# Patient Record
Sex: Female | Born: 2000 | Race: White | Hispanic: No | Marital: Single | Smoking: Never smoker
Health system: Southern US, Community
[De-identification: ages and names within clinical notes are randomized; demographics above are authoritative.]

## PROBLEM LIST (undated history)

## (undated) ENCOUNTER — Inpatient Hospital Stay: Payer: Self-pay

## (undated) ENCOUNTER — Inpatient Hospital Stay: Admission: RE | Payer: MEDICAID | Source: Home / Self Care

## (undated) DIAGNOSIS — N200 Calculus of kidney: Secondary | ICD-10-CM

## (undated) DIAGNOSIS — T4145XA Adverse effect of unspecified anesthetic, initial encounter: Secondary | ICD-10-CM

## (undated) DIAGNOSIS — R51 Headache: Secondary | ICD-10-CM

## (undated) DIAGNOSIS — R519 Headache, unspecified: Secondary | ICD-10-CM

## (undated) DIAGNOSIS — T8859XA Other complications of anesthesia, initial encounter: Secondary | ICD-10-CM

## (undated) DIAGNOSIS — R112 Nausea with vomiting, unspecified: Secondary | ICD-10-CM

## (undated) DIAGNOSIS — F419 Anxiety disorder, unspecified: Secondary | ICD-10-CM

## (undated) DIAGNOSIS — J45909 Unspecified asthma, uncomplicated: Secondary | ICD-10-CM

## (undated) DIAGNOSIS — Z8489 Family history of other specified conditions: Secondary | ICD-10-CM

## (undated) DIAGNOSIS — Z9889 Other specified postprocedural states: Secondary | ICD-10-CM

## (undated) HISTORY — PX: TONSILLECTOMY: SUR1361

## (undated) HISTORY — PX: CYSTOSCOPY: SUR368

---
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
## 2019-09-20 ENCOUNTER — Encounter: Payer: Self-pay | Admitting: Emergency Medicine

## 2019-09-20 ENCOUNTER — Emergency Department: Payer: Medicaid Other

## 2019-09-20 ENCOUNTER — Other Ambulatory Visit: Payer: Self-pay

## 2019-09-20 DIAGNOSIS — Z91040 Latex allergy status: Secondary | ICD-10-CM | POA: Diagnosis not present

## 2019-09-20 DIAGNOSIS — Z79899 Other long term (current) drug therapy: Secondary | ICD-10-CM | POA: Diagnosis not present

## 2019-09-20 DIAGNOSIS — R1031 Right lower quadrant pain: Secondary | ICD-10-CM | POA: Diagnosis present

## 2019-09-20 DIAGNOSIS — J45909 Unspecified asthma, uncomplicated: Secondary | ICD-10-CM | POA: Insufficient documentation

## 2019-09-20 DIAGNOSIS — R1033 Periumbilical pain: Secondary | ICD-10-CM | POA: Diagnosis not present

## 2019-09-20 LAB — URINALYSIS, COMPLETE (UACMP) WITH MICROSCOPIC
Bacteria, UA: NONE SEEN
Bilirubin Urine: NEGATIVE
Glucose, UA: NEGATIVE mg/dL
Hgb urine dipstick: NEGATIVE
Ketones, ur: NEGATIVE mg/dL
Nitrite: NEGATIVE
Protein, ur: NEGATIVE mg/dL
Specific Gravity, Urine: 1.014 (ref 1.005–1.030)
pH: 6 (ref 5.0–8.0)

## 2019-09-20 LAB — COMPREHENSIVE METABOLIC PANEL
ALT: 13 U/L (ref 0–44)
AST: 21 U/L (ref 15–41)
Albumin: 4.4 g/dL (ref 3.5–5.0)
Alkaline Phosphatase: 61 U/L (ref 38–126)
Anion gap: 10 (ref 5–15)
BUN: 8 mg/dL (ref 6–20)
CO2: 23 mmol/L (ref 22–32)
Calcium: 9.2 mg/dL (ref 8.9–10.3)
Chloride: 105 mmol/L (ref 98–111)
Creatinine, Ser: 0.67 mg/dL (ref 0.44–1.00)
GFR calc Af Amer: >60 mL/min (ref >60)
GFR calc non Af Amer: >60 mL/min (ref >60)
Glucose, Bld: 118 mg/dL — ABNORMAL HIGH (ref 70–99)
Potassium: 3.5 mmol/L (ref 3.5–5.1)
Sodium: 138 mmol/L (ref 135–145)
Total Bilirubin: 0.6 mg/dL (ref 0.3–1.2)
Total Protein: 7.6 g/dL (ref 6.5–8.1)

## 2019-09-20 LAB — CBC
HCT: 39.4 % (ref 36.0–46.0)
Hemoglobin: 13.6 g/dL (ref 12.0–15.0)
MCH: 30.9 pg (ref 26.0–34.0)
MCHC: 34.5 g/dL (ref 30.0–36.0)
MCV: 89.5 fL (ref 80.0–100.0)
Platelets: 251 10*3/uL (ref 150–400)
RBC: 4.4 MIL/uL (ref 3.87–5.11)
RDW: 12.2 % (ref 11.5–15.5)
WBC: 8.1 10*3/uL (ref 4.0–10.5)
nRBC: 0 % (ref 0.0–0.2)

## 2019-09-20 LAB — POCT PREGNANCY, URINE: Preg Test, Ur: NEGATIVE

## 2019-09-20 LAB — LIPASE, BLOOD: Lipase: 34 U/L (ref 11–51)

## 2019-09-20 MED ORDER — IOHEXOL 300 MG/ML  SOLN
100.0000 mL | Freq: Once | INTRAMUSCULAR | Status: AC | PRN
  Administered 2019-09-20: 100 mL via INTRAVENOUS

## 2019-09-20 NOTE — ED Triage Notes (Signed)
Pt arrives ambulatory to triage with LRQ abdominal pain x2 days. Pt reports intermittent pain and nausea with this. Pt also reports that pressure to the area increases pain. Pt is in NAD at this time.

## 2019-09-21 ENCOUNTER — Emergency Department
Admission: EM | Admit: 2019-09-21 | Discharge: 2019-09-21 | Disposition: A | Discharge status: Home / Self Care | Payer: Medicaid Other | Attending: Emergency Medicine | Admitting: Emergency Medicine

## 2019-09-21 DIAGNOSIS — R1084 Generalized abdominal pain: Secondary | ICD-10-CM

## 2019-09-21 MED ORDER — ONDANSETRON 4 MG PO TBDP: 4.0000 mg | ORAL_TABLET | Freq: Three times a day (TID) | ORAL | 0 refills | Status: DC | PRN

## 2019-09-21 MED ORDER — OMEPRAZOLE 20 MG PO CPDR: 20.0000 mg | DELAYED_RELEASE_CAPSULE | Freq: Every day | ORAL | 0 refills | Status: DC

## 2019-09-21 MED ORDER — DICYCLOMINE HCL 20 MG PO TABS: 20.0000 mg | ORAL_TABLET | Freq: Three times a day (TID) | ORAL | 0 refills | Status: DC

## 2019-09-21 MED ORDER — ALUM & MAG HYDROXIDE-SIMETH 200-200-20 MG/5ML PO SUSP
30.0000 mL | Freq: Once | ORAL | Status: AC
  Administered 2019-09-21: 30 mL via ORAL
  Filled 2019-09-21: qty 30

## 2019-09-21 MED ORDER — DICYCLOMINE HCL 10 MG PO CAPS
20.0000 mg | ORAL_CAPSULE | Freq: Once | ORAL | Status: AC
  Administered 2019-09-21: 20 mg via ORAL
  Filled 2019-09-21: qty 2

## 2019-09-21 MED ORDER — ONDANSETRON HCL 4 MG/2ML IJ SOLN
4.0000 mg | Freq: Once | INTRAMUSCULAR | Status: AC
  Administered 2019-09-21: 4 mg via INTRAVENOUS
  Filled 2019-09-21: qty 2

## 2019-09-21 NOTE — ED Provider Notes (Signed)
[facility] Emergency Department Provider Note  ____________________________________________   First MD Initiated Contact with Patient 09/21/19 [phone]     (approximate)  I have reviewed the triage vital signs and the nursing notes.   HISTORY  Chief Complaint Abdominal Pain    HPI Tiffany Bush is a 19 y.o. female  Here with abd pain. Pt reports that for the past week, she's had intermittent aching, cramping, periumbilical and RLQ pain. It has been transient but persistently worsening. It seems to worsen with standing and moving, but is also occasionally present at rest. She has also had some epigastric pressure like feeling w/ it with nausea. She notes the pain mildly worsens with eating, stress. No alleviating factors. No change in bowels. No vaginal bleeding or discharge. No dyspareunia. No other complaints.        Past Medical History:  Diagnosis Date  . Anxiety    no meds   . Asthma    well controlled-exercise induced  . Complication of anesthesia   . Family history of adverse reaction to anesthesia    mom-n/v  . Headache    more frequent during the summer with her allergies  . Kidney stones   . PONV (postoperative nausea and vomiting)    nausea    There are no problems to display for this patient.   Past Surgical History:  Procedure Laterality Date  . CYSTOSCOPY    . LAPAROSCOPY N/A 04/14/2018   Procedure: LAPAROSCOPY DIAGNOSTIC;  Surgeon: Feliberto Gottron Ihor Austin, MD;  Location: [facility] ORS;  Service: Gynecology;  Laterality: N/A;  . LAPAROSCOPY N/A 04/14/2018   Procedure: LAPAROSCOPY OPERATIVE;  Surgeon: Feliberto Gottron Ihor Austin, MD;  Location: [facility] ORS;  Service: Gynecology;  Laterality: N/A;  . TONSILLECTOMY     age 4    Prior to Admission medications   Medication Sig Start Date End Date Taking? Authorizing Provider  albuterol (PROVENTIL HFA;VENTOLIN HFA) 108 (90 Base) MCG/ACT inhaler Inhale 2 puffs into the lungs every 6 (six) hours as  needed for wheezing or shortness of breath (PER MOM, PT CURRENTLY OUT OF INHALER AND WILL NEED TO GET ANOTHER RX).  10/07/16   [provider]  cetirizine (ZYRTEC) 10 MG tablet Take 10 mg by mouth at bedtime.     [provider]  dicyclomine (BENTYL) 20 MG tablet Take 1 tablet (20 mg total) by mouth 4 (four) times daily -  before meals and at bedtime for 5 days. 09/21/19 09/26/19  Shaune Pollack, MD  ketorolac (TORADOL) 10 MG tablet Take 1 tablet (10 mg total) by mouth every 8 (eight) hours as needed. Patient not taking: Reported on 01/16/2017 10/29/15   Darci Current, MD  loperamide (IMODIUM A-D) 2 MG tablet Take 1 tablet (2 mg total) by mouth 4 (four) times daily as needed for diarrhea or loose stools. Patient not taking: Reported on 04/08/2018 01/16/17   Rockne Menghini, MD  omeprazole (PRILOSEC) 20 MG capsule Take 1 capsule (20 mg total) by mouth daily for 10 days. 09/21/19 10/01/19  Shaune Pollack, MD  ondansetron (ZOFRAN ODT) 4 MG disintegrating tablet Take 1 tablet (4 mg total) by mouth every 8 (eight) hours as needed for nausea or vomiting. 09/21/19   Shaune Pollack, MD  phenazopyridine (PYRIDIUM) 100 MG tablet Take 1 tablet (100 mg total) by mouth 3 (three) times daily as needed for pain. Patient not taking: Reported on 04/08/2018 08/26/16   Menshew, Charlesetta Ivory, PA-C  triamcinolone ointment (KENALOG) 0.1 % Apply 1  application topically 2 (two) times daily as needed (for eczema).  10/07/16   [provider]  valACYclovir (VALTREX) 500 MG tablet Take 500 mg by mouth daily as needed (for cold sores).  01/08/17   [provider]    Allergies Latex  No family history on file.  Social History Social History   Tobacco Use  . Smoking status: Never Smoker  . Smokeless tobacco: Never Used  Substance Use Topics  . Alcohol use: No  . Drug use: Never    Review of Systems  Review of Systems  Constitutional: Positive for fatigue. Negative for chills and  fever.  HENT: Negative for sore throat.   Respiratory: Negative for shortness of breath.   Cardiovascular: Negative for chest pain.  Gastrointestinal: Positive for nausea and vomiting. Negative for abdominal pain.  Genitourinary: Negative for flank pain.  Musculoskeletal: Negative for neck pain.  Skin: Negative for rash and wound.  Allergic/Immunologic: Negative for immunocompromised state.  Neurological: Negative for weakness and numbness.  Hematological: Does not bruise/bleed easily.     ____________________________________________  PHYSICAL EXAM:      VITAL SIGNS: ED Triage Vitals  Enc Vitals Group     BP 09/20/19 2144 (!) 125/54     Pulse Rate 09/20/19 2144 67     Resp 09/20/19 2144 17     Temp 09/20/19 2144 98.9 F (37.2 C)     Temp Source 09/20/19 2144 Oral     SpO2 09/20/19 2144 100 %     Weight 09/20/19 2138 160 lb (72.6 kg)     Height 09/20/19 2138 5\' 3"  (1.6 m)     Head Circumference --      Peak Flow --      Pain Score 09/20/19 2138 10     Pain Loc --      Pain Edu? --      Excl. in GC? --      Physical Exam Vitals and nursing note reviewed.  Constitutional:      General: She is not in acute distress.    Appearance: She is well-developed.  HENT:     Head: Normocephalic and atraumatic.  Eyes:     Conjunctiva/sclera: Conjunctivae normal.  Cardiovascular:     Rate and Rhythm: Normal rate and regular rhythm.     Heart sounds: Normal heart sounds. No murmur. No friction rub.  Pulmonary:     Effort: Pulmonary effort is normal. No respiratory distress.     Breath sounds: Normal breath sounds. No wheezing or rales.  Abdominal:     General: There is no distension.     Palpations: Abdomen is soft.     Tenderness: There is abdominal tenderness in the right lower quadrant, periumbilical area and suprapubic area. There is no guarding or rebound.  Musculoskeletal:     Cervical back: Neck supple.  Skin:    General: Skin is warm.     Capillary Refill: Capillary  refill takes less than 2 seconds.  Neurological:     Mental Status: She is alert and oriented to person, place, and time.     Motor: No abnormal muscle tone.       ____________________________________________   LABS (all labs ordered are listed, but only abnormal results are displayed)  Labs Reviewed  COMPREHENSIVE METABOLIC PANEL - Abnormal; Notable for the following components:      Result Value   Glucose, Bld 118 (*)    All other components within normal limits  URINALYSIS, COMPLETE (UACMP) WITH MICROSCOPIC -  Abnormal; Notable for the following components:   Color, Urine YELLOW (*)    APPearance HAZY (*)    Leukocytes,Ua SMALL (*)    All other components within normal limits  LIPASE, BLOOD  CBC  POC URINE PREG, ED  POCT PREGNANCY, URINE    ____________________________________________  EKG: None ________________________________________  RADIOLOGY All imaging, including plain films, CT scans, and ultrasounds, independently reviewed by me, and interpretations confirmed via formal radiology reads.  ED MD interpretation:   CT A/P: Normal, normal appendix, no acute abnormality  Official radiology report(s): CT ABDOMEN PELVIS W CONTRAST  Result Date: 09/20/2019 CLINICAL DATA:  Right lower quadrant pain. Nausea. EXAM: CT ABDOMEN AND PELVIS WITH CONTRAST TECHNIQUE: Multidetector CT imaging of the abdomen and pelvis was performed using the standard protocol following bolus administration of intravenous contrast. CONTRAST:  OMNIPAQUE IOHEXOL 300 MG/ML  SOLN COMPARISON:  Noncontrast CT 10/21/2016. Pelvic ultrasound 03/29/2018 FINDINGS: Lower chest: Lung bases are clear. Hepatobiliary: No focal liver abnormality is seen. No gallstones, gallbladder wall thickening, or biliary dilatation. Pancreas: No ductal dilatation or inflammation. Spleen: Normal in size without focal abnormality. Splenule is anteriorly. Adrenals/Urinary Tract: Normal adrenal glands. No hydronephrosis or  perinephric edema. Homogeneous renal enhancement. Urinary bladder is nondistended, otherwise unremarkable. Stomach/Bowel: Stomach is within normal limits. Appendix is normal, series 2, image 55. No evidence of bowel wall thickening, distention, or inflammatory changes. Terminal ileum is normal. Vascular/Lymphatic: Abdominal aorta and IVC are unremarkable. The portal vein is patent. Mesenteric vessels are patent. No adenopathy. Reproductive: Symmetric prominently sized ovaries with multiple follicles, similar to prior pelvic ultrasound. No ovarian cyst or adnexal mass. Uterus is unremarkable. Other: No abdominal wall hernia or abnormality. No abdominopelvic ascites. Musculoskeletal: There are no acute or suspicious osseous abnormalities. IMPRESSION: No acute abnormality or explanation for abdominal pain. Normal appendix. Electronically Signed   By: Narda Rutherford M.D.   On: 09/20/2019 22:56    ____________________________________________  PROCEDURES   Procedure(s) performed (including Critical Care):  Procedures  ____________________________________________  INITIAL IMPRESSION / MDM / ASSESSMENT AND PLAN / ED COURSE  As part of my medical decision making, I reviewed the following data within the electronic MEDICAL RECORD NUMBER Nursing notes reviewed and incorporated, Old chart reviewed, Notes from prior ED visits, and [place] Controlled Substance Database       *Tiffany Bush was evaluated in Emergency Department on 09/21/2019 for the symptoms described in the history of present illness. She was evaluated in the context of the global COVID-19 pandemic, which necessitated consideration that the patient might be at risk for infection with the SARS-CoV-2 virus that causes COVID-19. Institutional protocols and algorithms that pertain to the evaluation of patients at risk for COVID-19 are in a state of rapid change based on information released by regulatory bodies including the CDC and federal and state  organizations. These policies and algorithms were followed during the patient's care in the ED.  Some ED evaluations and interventions may be delayed as a result of limited staffing during the pandemic.*     Medical Decision Making:  19 yo F here with mild periumbilical and RLQ abd pain. Pt VSS and labs are overall very reassuring. Normal WBC, normal renal function, LFTs, and lipase. No evidence of UTI clinically or on UA. UPT negative. CT scan obtained in triage reviewed by me and is negative. Do not suspect appendicitis, cholecystitis, or other surgical abnormality. DDx includes gastritis/PUD, alcoholic gastritis, possibly IBD given relation w/ anxiety. Denies any high risk sexual activity, vaginal  bleeding/discharge or sx of PID or GU etiology. Will treat symptomatically, d/c with outpt follow-up.  ____________________________________________  FINAL CLINICAL IMPRESSION(S) / ED DIAGNOSES  Final diagnoses:  Generalized abdominal pain     MEDICATIONS GIVEN DURING THIS VISIT:  Medications  iohexol (OMNIPAQUE) 300 MG/ML solution 100 mL (100 mLs Intravenous Contrast Given 09/20/19 2243)  dicyclomine (BENTYL) capsule 20 mg (20 mg Oral Given 09/21/19 0624)  ondansetron (ZOFRAN) injection 4 mg (4 mg Intravenous Given 09/21/19 0624)  alum & mag hydroxide-simeth (MAALOX/MYLANTA) 200-200-20 MG/5ML suspension 30 mL (30 mLs Oral Given 09/21/19 0634)     ED Discharge Orders         Ordered    omeprazole (PRILOSEC) 20 MG capsule  Daily     09/21/19 0702    ondansetron (ZOFRAN ODT) 4 MG disintegrating tablet  Every 8 hours PRN     09/21/19 0702    dicyclomine (BENTYL) 20 MG tablet  3 times daily before meals & bedtime     09/21/19 4259           Note:  This document was prepared using Dragon voice recognition software and may include unintentional dictation errors.   Duffy Bruce, MD 09/21/19 [phone]

## 2019-09-21 NOTE — ED Notes (Signed)
NAD noted at time of D/C. Pt denies questions or concerns. Pt ambulatory to the lobby at this time.  

## 2019-12-21 ENCOUNTER — Emergency Department
Admission: EM | Admit: 2019-12-21 | Discharge: 2019-12-21 | Disposition: A | Discharge status: Home / Self Care | Payer: Medicaid Other | Attending: Emergency Medicine | Admitting: Emergency Medicine

## 2019-12-21 ENCOUNTER — Other Ambulatory Visit: Payer: Self-pay

## 2019-12-21 DIAGNOSIS — J45909 Unspecified asthma, uncomplicated: Secondary | ICD-10-CM | POA: Insufficient documentation

## 2019-12-21 DIAGNOSIS — Z79899 Other long term (current) drug therapy: Secondary | ICD-10-CM | POA: Diagnosis not present

## 2019-12-21 DIAGNOSIS — Z91040 Latex allergy status: Secondary | ICD-10-CM | POA: Insufficient documentation

## 2019-12-21 DIAGNOSIS — N898 Other specified noninflammatory disorders of vagina: Secondary | ICD-10-CM | POA: Diagnosis present

## 2019-12-21 DIAGNOSIS — A5901 Trichomonal vulvovaginitis: Secondary | ICD-10-CM | POA: Insufficient documentation

## 2019-12-21 LAB — COMPREHENSIVE METABOLIC PANEL
ALT: 13 U/L (ref 0–44)
AST: 20 U/L (ref 15–41)
Albumin: 4.3 g/dL (ref 3.5–5.0)
Alkaline Phosphatase: 65 U/L (ref 38–126)
Anion gap: 7 (ref 5–15)
BUN: 8 mg/dL (ref 6–20)
CO2: 25 mmol/L (ref 22–32)
Calcium: 9.2 mg/dL (ref 8.9–10.3)
Chloride: 108 mmol/L (ref 98–111)
Creatinine, Ser: 0.83 mg/dL (ref 0.44–1.00)
GFR calc Af Amer: >60 mL/min (ref >60)
GFR calc non Af Amer: >60 mL/min (ref >60)
Glucose, Bld: 96 mg/dL (ref 70–99)
Potassium: 3.6 mmol/L (ref 3.5–5.1)
Sodium: 140 mmol/L (ref 135–145)
Total Bilirubin: 0.9 mg/dL (ref 0.3–1.2)
Total Protein: 7.4 g/dL (ref 6.5–8.1)

## 2019-12-21 LAB — URINALYSIS, COMPLETE (UACMP) WITH MICROSCOPIC
Bacteria, UA: NONE SEEN
Bilirubin Urine: NEGATIVE
Glucose, UA: NEGATIVE mg/dL
Hgb urine dipstick: NEGATIVE
Ketones, ur: NEGATIVE mg/dL
Nitrite: NEGATIVE
Protein, ur: NEGATIVE mg/dL
Specific Gravity, Urine: 1.006 (ref 1.005–1.030)
pH: 7 (ref 5.0–8.0)

## 2019-12-21 LAB — CBC
HCT: 39.6 % (ref 36.0–46.0)
Hemoglobin: 14.3 g/dL (ref 12.0–15.0)
MCH: 32 pg (ref 26.0–34.0)
MCHC: 36.1 g/dL — ABNORMAL HIGH (ref 30.0–36.0)
MCV: 88.6 fL (ref 80.0–100.0)
Platelets: 242 10*3/uL (ref 150–400)
RBC: 4.47 MIL/uL (ref 3.87–5.11)
RDW: 12 % (ref 11.5–15.5)
WBC: 10 10*3/uL (ref 4.0–10.5)
nRBC: 0 % (ref 0.0–0.2)

## 2019-12-21 LAB — POCT PREGNANCY, URINE: Preg Test, Ur: NEGATIVE

## 2019-12-21 LAB — WET PREP, GENITAL
Clue Cells Wet Prep HPF POC: NONE SEEN
Sperm: NONE SEEN
Yeast Wet Prep HPF POC: NONE SEEN

## 2019-12-21 LAB — LIPASE, BLOOD: Lipase: 29 U/L (ref 11–51)

## 2019-12-21 LAB — CHLAMYDIA/NGC RT PCR (ARMC ONLY)
Chlamydia Tr: DETECTED — AB
N gonorrhoeae: NOT DETECTED

## 2019-12-21 MED ORDER — METRONIDAZOLE 500 MG PO TABS: 500.0000 mg | ORAL_TABLET | Freq: Two times a day (BID) | ORAL | 0 refills | Status: DC

## 2019-12-21 MED ORDER — SODIUM CHLORIDE 0.9% FLUSH: 3.0000 mL | Freq: Once | INTRAVENOUS | Status: DC

## 2019-12-21 NOTE — ED Notes (Signed)
Pelvic exam cart place in room. UA sent to lab.

## 2019-12-21 NOTE — ED Triage Notes (Signed)
Pt c/o watery discharge for the past couple of day with lower abd pain.

## 2019-12-21 NOTE — ED Notes (Signed)
See triage note  Presents with some vaginal discharge  States she noticed "clear,watery "discharge yesterday  Also thinks she may have yeast infection so she used Mono stat

## 2019-12-21 NOTE — ED Provider Notes (Signed)
[facility] Emergency Department Provider Note   ____________________________________________   First MD Initiated Contact with Patient 12/21/19 1412     (approximate)  I have reviewed the triage vital signs and the nursing notes.   HISTORY  Chief Complaint Vaginal Discharge   HPI Tiffany Bush is a 19 y.o. female presents to the ED with complaint of vaginal discharge that began yesterday.  Patient states that she began having itching and felt "raw".  Patient thought she had a yeast infection and began using Monistat this morning.  She states that initially the vaginal discharge was a yellow watery appearance.  Rates her discomfort as a 10/10.     Past Medical History:  Diagnosis Date  . Anxiety    no meds   . Asthma    well controlled-exercise induced  . Complication of anesthesia   . Family history of adverse reaction to anesthesia    mom-n/v  . Headache    more frequent during the summer with her allergies  . Kidney stones   . PONV (postoperative nausea and vomiting)    nausea    There are no problems to display for this patient.   Past Surgical History:  Procedure Laterality Date  . CYSTOSCOPY    . LAPAROSCOPY N/A 04/14/2018   Procedure: LAPAROSCOPY DIAGNOSTIC;  Surgeon: Feliberto Gottron Ihor Austin, MD;  Location: [facility] ORS;  Service: Gynecology;  Laterality: N/A;  . LAPAROSCOPY N/A 04/14/2018   Procedure: LAPAROSCOPY OPERATIVE;  Surgeon: Feliberto Gottron Ihor Austin, MD;  Location: [facility] ORS;  Service: Gynecology;  Laterality: N/A;  . TONSILLECTOMY     age 50    Prior to Admission medications   Medication Sig Start Date End Date Taking? Authorizing Provider  albuterol (PROVENTIL HFA;VENTOLIN HFA) 108 (90 Base) MCG/ACT inhaler Inhale 2 puffs into the lungs every 6 (six) hours as needed for wheezing or shortness of breath (PER MOM, PT CURRENTLY OUT OF INHALER AND WILL NEED TO GET ANOTHER RX).  10/07/16   [provider]  cetirizine (ZYRTEC)  10 MG tablet Take 10 mg by mouth at bedtime.     [provider]  dicyclomine (BENTYL) 20 MG tablet Take 1 tablet (20 mg total) by mouth 4 (four) times daily -  before meals and at bedtime for 5 days. 09/21/19 09/26/19  Shaune Pollack, MD  metroNIDAZOLE (FLAGYL) 500 MG tablet Take 1 tablet (500 mg total) by mouth 2 (two) times daily. 12/21/19   Tommi Rumps, PA-C  omeprazole (PRILOSEC) 20 MG capsule Take 1 capsule (20 mg total) by mouth daily for 10 days. 09/21/19 10/01/19  Shaune Pollack, MD  triamcinolone ointment (KENALOG) 0.1 % Apply 1 application topically 2 (two) times daily as needed (for eczema).  10/07/16   [provider]  valACYclovir (VALTREX) 500 MG tablet Take 500 mg by mouth daily as needed (for cold sores).  01/08/17   [provider]    Allergies Latex  No family history on file.  Social History Social History   Tobacco Use  . Smoking status: Never Smoker  . Smokeless tobacco: Never Used  Substance Use Topics  . Alcohol use: No  . Drug use: Never    Review of Systems Constitutional: No fever/chills Cardiovascular: Denies chest pain. Respiratory: Denies shortness of breath. Gastrointestinal: No abdominal pain.  No nausea, no vomiting.   Genitourinary: Negative for dysuria.  Positive vaginal discharge. Musculoskeletal: Negative for back pain. Skin: Negative for rash. Neurological: Negative for headaches, focal weakness or numbness. ____________________________________________  PHYSICAL EXAM:  VITAL SIGNS: ED Triage Vitals  Enc Vitals Group     BP 12/21/19 1316 135/70     Pulse Rate 12/21/19 1316 83     Resp 12/21/19 1316 16     Temp 12/21/19 1316 98 F (36.7 C)     Temp Source 12/21/19 1316 Oral     SpO2 12/21/19 1316 99 %     Weight 12/21/19 1414 160 lb 15 oz (73 kg)     Height 12/21/19 1414 5\' 3"  (1.6 m)     Head Circumference --      Peak Flow --      Pain Score 12/21/19 1310 10     Pain Loc --      Pain Edu? --       Excl. in GC? --     Constitutional: Alert and oriented. Well appearing and in no acute distress. Eyes: Conjunctivae are normal. PERRL. EOMI. Head: Atraumatic. Neck: No stridor.   Cardiovascular: Normal rate, regular rhythm. Grossly normal heart sounds.  Good peripheral circulation. Respiratory: Normal respiratory effort.  No retractions. Lungs CTAB. Gastrointestinal: Soft and nontender. No distention. Genitourinary: External genitalia was unremarkable.  There is moderate amount of yellow secretions noted especially in the vaginal cuff.  Wet prep and cultures were obtained.  Patient had no adnexal masses or tenderness.  No cervical motion tenderness. Musculoskeletal: Moves upper and lower extremities that any difficulty normal gait was noted. Neurologic:  Normal speech and language. No gross focal neurologic deficits are appreciated. No gait instability. Skin:  Skin is warm, dry and intact. No rash noted. Psychiatric: Mood and affect are normal. Speech and behavior are normal.  ____________________________________________   LABS (all labs ordered are listed, but only abnormal results are displayed)  Labs Reviewed  WET PREP, GENITAL - Abnormal; Notable for the following components:      Result Value   Trich, Wet Prep PRESENT (*)    WBC, Wet Prep HPF POC MANY (*)    All other components within normal limits  CBC - Abnormal; Notable for the following components:   MCHC 36.1 (*)    All other components within normal limits  URINALYSIS, COMPLETE (UACMP) WITH MICROSCOPIC - Abnormal; Notable for the following components:   Color, Urine YELLOW (*)    APPearance HAZY (*)    Leukocytes,Ua MODERATE (*)    All other components within normal limits  CHLAMYDIA/NGC RT PCR ([facility] ONLY)  LIPASE, BLOOD  COMPREHENSIVE METABOLIC PANEL  POC URINE PREG, ED  POCT PREGNANCY, URINE     PROCEDURES  Procedure(s) performed (including Critical  Care):  Procedures   ____________________________________________   INITIAL IMPRESSION / ASSESSMENT AND PLAN / ED COURSE  As part of my medical decision making, I reviewed the following data within the electronic MEDICAL RECORD NUMBER Notes from prior ED visits and [place] Controlled Substance Database  19 year old female presents to the ED with complaint of vaginal itching and feeling well since yesterday.  Patient states that she used Monistat this morning without any relief.  Patient states that the itching has gotten worse.  She states that prior to using the Monistat she saw a clear watery discharge.  Wet prep shows trichomonas and patient was made aware that this is a sexually transmitted disease.  A prescription for Flagyl was sent to her pharmacy.  She is also made aware that the chlamydia and gonorrhea test has not resulted and if this is positive she will get a phone call letting her know  that.  She may follow-up with the [facility] should she continue to have any continued problems.  ____________________________________________   FINAL CLINICAL IMPRESSION(S) / ED DIAGNOSES  Final diagnoses:  Trichomonas vaginitis     ED Discharge Orders         Ordered    metroNIDAZOLE (FLAGYL) 500 MG tablet  2 times daily     12/21/19 1549           Note:  This document was prepared using Dragon voice recognition software and may include unintentional dictation errors.    Johnn Hai, PA-C 12/21/19 1554    Blake Divine, MD 12/22/19 [phone]

## 2019-12-21 NOTE — Discharge Instructions (Signed)
Follow-up with your primary care provider or [facility] department if any continued problems.  Also your sexual partner will need to be treated as well.  This is a sexually transmitted disease.  Begin taking the Flagyl twice a day for the next 7 days.  You may discontinue using the Monistat as it will not help with this and there was no yeast seen on your wet prep.  A gonorrhea and Chlamydia test was sent to the lab.  This information will result most likely later this evening.  If this is positive you will get a phone call and you may be treated for free at the health department.

## 2019-12-23 ENCOUNTER — Telehealth: Payer: Self-pay | Admitting: Emergency Medicine

## 2019-12-29 ENCOUNTER — Encounter: Payer: Self-pay | Admitting: Emergency Medicine

## 2019-12-29 ENCOUNTER — Other Ambulatory Visit: Payer: Self-pay

## 2019-12-29 ENCOUNTER — Emergency Department
Admission: EM | Admit: 2019-12-29 | Discharge: 2019-12-29 | Disposition: A | Discharge status: Home / Self Care | Payer: Medicaid Other | Attending: Emergency Medicine | Admitting: Emergency Medicine

## 2019-12-29 ENCOUNTER — Emergency Department: Payer: Medicaid Other

## 2019-12-29 DIAGNOSIS — J069 Acute upper respiratory infection, unspecified: Secondary | ICD-10-CM | POA: Insufficient documentation

## 2019-12-29 DIAGNOSIS — J45909 Unspecified asthma, uncomplicated: Secondary | ICD-10-CM | POA: Diagnosis not present

## 2019-12-29 DIAGNOSIS — Z91040 Latex allergy status: Secondary | ICD-10-CM | POA: Insufficient documentation

## 2019-12-29 DIAGNOSIS — R05 Cough: Secondary | ICD-10-CM | POA: Diagnosis present

## 2019-12-29 LAB — POCT PREGNANCY, URINE: Preg Test, Ur: NEGATIVE

## 2019-12-29 MED ORDER — METHYLPREDNISOLONE 4 MG PO TBPK: ORAL_TABLET | ORAL | 0 refills | Status: DC

## 2019-12-29 MED ORDER — PSEUDOEPH-BROMPHEN-DM 30-2-10 MG/5ML PO SYRP: 5.0000 mL | ORAL_SOLUTION | Freq: Four times a day (QID) | ORAL | 0 refills | Status: DC | PRN

## 2019-12-29 NOTE — ED Triage Notes (Addendum)
Pt presents to ED with non-productive cough and nasal congestion for the past 3 days. Pt states her chest is burning and she feels like she needs a breathing treatment. Has inhaler at home but pt states it doesn't work for her

## 2019-12-29 NOTE — ED Provider Notes (Signed)
[facility] Emergency Department Provider Note   ____________________________________________   First MD Initiated Contact with Patient 12/29/19 [phone]     (approximate)  I have reviewed the triage vital signs and the nursing notes.   HISTORY  Chief Complaint Cough and Nasal Congestion    HPI Tiffany Bush is a 19 y.o. female patient complain of cough and nasal congestion for 3 days.  Patient stated feel like her chest is "burning".  Patient states she has an inhaler at home was not working.  Patient denies recent travel or known exposure to COVID-19.  Wants to go on record that she refused to take a Covid test.      Past Medical History:  Diagnosis Date  . Anxiety    no meds   . Asthma    well controlled-exercise induced  . Complication of anesthesia   . Family history of adverse reaction to anesthesia    mom-n/v  . Headache    more frequent during the summer with her allergies  . Kidney stones   . PONV (postoperative nausea and vomiting)    nausea    There are no problems to display for this patient.   Past Surgical History:  Procedure Laterality Date  . CYSTOSCOPY    . LAPAROSCOPY N/A 04/14/2018   Procedure: LAPAROSCOPY DIAGNOSTIC;  Surgeon: Ouida Sills Gwen Her, MD;  Location: [facility] ORS;  Service: Gynecology;  Laterality: N/A;  . LAPAROSCOPY N/A 04/14/2018   Procedure: LAPAROSCOPY OPERATIVE;  Surgeon: Ouida Sills Gwen Her, MD;  Location: [facility] ORS;  Service: Gynecology;  Laterality: N/A;  . TONSILLECTOMY     age 46    Prior to Admission medications   Medication Sig Start Date End Date Taking? Authorizing Provider  albuterol (PROVENTIL HFA;VENTOLIN HFA) 108 (90 Base) MCG/ACT inhaler Inhale 2 puffs into the lungs every 6 (six) hours as needed for wheezing or shortness of breath (PER MOM, PT CURRENTLY OUT OF INHALER AND WILL NEED TO GET ANOTHER RX).  10/07/16   [provider]  brompheniramine-pseudoephedrine-DM 30-2-10 MG/5ML  syrup Take 5 mLs by mouth 4 (four) times daily as needed. 12/29/19   Sable Feil, PA-C  cetirizine (ZYRTEC) 10 MG tablet Take 10 mg by mouth at bedtime.     [provider]  dicyclomine (BENTYL) 20 MG tablet Take 1 tablet (20 mg total) by mouth 4 (four) times daily -  before meals and at bedtime for 5 days. 09/21/19 09/26/19  Duffy Bruce, MD  methylPREDNISolone (MEDROL DOSEPAK) 4 MG TBPK tablet Take Tapered dose as directed 12/29/19   Sable Feil, PA-C  omeprazole (PRILOSEC) 20 MG capsule Take 1 capsule (20 mg total) by mouth daily for 10 days. 09/21/19 10/01/19  Duffy Bruce, MD  triamcinolone ointment (KENALOG) 0.1 % Apply 1 application topically 2 (two) times daily as needed (for eczema).  10/07/16   [provider]  valACYclovir (VALTREX) 500 MG tablet Take 500 mg by mouth daily as needed (for cold sores).  01/08/17   [provider]    Allergies Latex  No family history on file.  Social History Social History   Tobacco Use  . Smoking status: Never Smoker  . Smokeless tobacco: Never Used  Substance Use Topics  . Alcohol use: No  . Drug use: Never    Review of Systems Constitutional: No fever/chills Eyes: No visual changes. ENT: No sore throat.  Nasal congestion. Cardiovascular: Denies chest pain. Respiratory: Denies shortness of breath.  Nonproductive cough. Gastrointestinal: No abdominal  pain.  No nausea, no vomiting.  No diarrhea.  No constipation. Genitourinary: Negative for dysuria. Musculoskeletal: Negative for back pain. Skin: Negative for rash. Neurological: Negative for headaches, focal weakness or numbness. Psychiatric:  Anxiety Allergic/Immunilogical: Latex ____________________________________________   PHYSICAL EXAM:  VITAL SIGNS: ED Triage Vitals  Enc Vitals Group     BP 12/29/19 0310 131/68     Pulse Rate 12/29/19 0310 60     Resp 12/29/19 0310 16     Temp 12/29/19 0310 98.9 F (37.2 C)     Temp Source 12/29/19 0310  Oral     SpO2 12/29/19 0310 98 %     Weight 12/29/19 0304 160 lb (72.6 kg)     Height 12/29/19 0304 5\' 3"  (1.6 m)     Head Circumference --      Peak Flow --      Pain Score 12/29/19 0304 10     Pain Loc --      Pain Edu? --      Excl. in GC? --     Constitutional: Alert and oriented. Well appearing and in no acute distress. Nose: No congestion/rhinnorhea.  Edematous nasal turbinates clear rhinorrhea. Mouth/Throat: Mucous membranes are moist.  Oropharynx non-erythematous.  Postnasal drainage. Neck: No stridor.  Hematological/Lymphatic/Immunilogical: No cervical lymphadenopathy. Cardiovascular: Normal rate, regular rhythm. Grossly normal heart sounds.  Good peripheral circulation. Respiratory: Normal respiratory effort.  No retractions. Lungs CTAB. Skin:  Skin is warm, dry and intact. No rash noted. Psychiatric: Mood and affect are normal. Speech and behavior are normal.  ____________________________________________   LABS (all labs ordered are listed, but only abnormal results are displayed)  Labs Reviewed  POC URINE PREG, ED  POCT PREGNANCY, URINE   ____________________________________________  EKG   ____________________________________________  RADIOLOGY  ED MD interpretation:    Official radiology report(s): DG Chest 2 View  Result Date: 12/29/2019 CLINICAL DATA:  Cough EXAM: CHEST - 2 VIEW COMPARISON:  04/08/2013 FINDINGS: The heart size and mediastinal contours are within normal limits. Both lungs are clear. The visualized skeletal structures are unremarkable. IMPRESSION: No active cardiopulmonary disease. Electronically Signed   By: 04/10/2013 M.D.   On: 12/29/2019 03:37    ____________________________________________   PROCEDURES  Procedure(s) performed (including Critical Care):  Procedures   ____________________________________________   INITIAL IMPRESSION / ASSESSMENT AND PLAN / ED COURSE  As part of my medical decision making, I reviewed  the following data within the electronic MEDICAL RECORD NUMBER     Patient presents with with nasal congestion cough.  Discussed negative chest x-ray findings with patient.  Patient physical exam is consistent with viral respiratory infection with cough.  Patient refused COVID-19 test.  Patient given discharge care instruction advised take medication as directed.  Patient advised follow-up PCP.    Tiffany Bush was evaluated in Emergency Department on 12/29/2019 for the symptoms described in the history of present illness. She was evaluated in the context of the global COVID-19 pandemic, which necessitated consideration that the patient might be at risk for infection with the SARS-CoV-2 virus that causes COVID-19. Institutional protocols and algorithms that pertain to the evaluation of patients at risk for COVID-19 are in a state of rapid change based on information released by regulatory bodies including the CDC and federal and state organizations. These policies and algorithms were followed during the patient's care in the ED.       ____________________________________________   FINAL CLINICAL IMPRESSION(S) / ED DIAGNOSES  Final diagnoses:  Viral URI with  cough     ED Discharge Orders         Ordered    methylPREDNISolone (MEDROL DOSEPAK) 4 MG TBPK tablet     12/29/19 0843    brompheniramine-pseudoephedrine-DM 30-2-10 MG/5ML syrup  4 times daily PRN     12/29/19 0843           Note:  This document was prepared using Dragon voice recognition software and may include unintentional dictation errors.    Joni Reining, PA-C 12/29/19 Lisbeth Renshaw    Shaune Pollack, MD 01/04/20 [phone]

## 2019-12-29 NOTE — ED Notes (Signed)
See triage note  Presents with  Cough and some discomfort in chest with cough   Denies anyfever

## 2019-12-29 NOTE — Discharge Instructions (Addendum)
Your chest x-ray was unremarkable.  Follow discharge care instruction take medication as directed.

## 2020-01-20 ENCOUNTER — Emergency Department: Payer: Medicaid Other

## 2020-01-20 ENCOUNTER — Encounter: Payer: Self-pay | Admitting: Emergency Medicine

## 2020-01-20 ENCOUNTER — Other Ambulatory Visit: Payer: Self-pay

## 2020-01-20 ENCOUNTER — Emergency Department
Admission: EM | Admit: 2020-01-20 | Discharge: 2020-01-20 | Disposition: A | Discharge status: Home / Self Care | Payer: Medicaid Other | Attending: Emergency Medicine | Admitting: Emergency Medicine

## 2020-01-20 DIAGNOSIS — J45909 Unspecified asthma, uncomplicated: Secondary | ICD-10-CM | POA: Insufficient documentation

## 2020-01-20 DIAGNOSIS — M79671 Pain in right foot: Secondary | ICD-10-CM | POA: Insufficient documentation

## 2020-01-20 DIAGNOSIS — Z91040 Latex allergy status: Secondary | ICD-10-CM | POA: Diagnosis not present

## 2020-01-20 DIAGNOSIS — M79672 Pain in left foot: Secondary | ICD-10-CM | POA: Insufficient documentation

## 2020-01-20 DIAGNOSIS — Z79899 Other long term (current) drug therapy: Secondary | ICD-10-CM | POA: Insufficient documentation

## 2020-01-20 MED ORDER — MELOXICAM 7.5 MG PO TABS: 7.5000 mg | ORAL_TABLET | Freq: Every day | ORAL | 0 refills | Status: AC

## 2020-01-20 NOTE — ED Notes (Signed)
Pt otf for imaging 

## 2020-01-20 NOTE — ED Provider Notes (Signed)
[facility] Emergency Department Provider Note  ____________________________________________  Time seen: Approximately 10:22 AM  I have reviewed the triage vital signs and the nursing notes.   HISTORY  Chief Complaint Leg Pain    HPI Tiffany Bush is a 19 y.o. female that presents to the emergency department for evaluation of bilateral shin, ankle, foot pain for 1 to 2 years.  Patient states that pain starts when she is up on her feet all day.  Her feet hurt most of the day, but her ankle starts to hurt after she has been standing for long periods.  She has tried several different shoes.  She currently wears sketchers.  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  No specific trauma.  Patient has tried Tylenol and Motrin without relief.  Patient takes oral birth control.  Past Medical History:  Diagnosis Date  . Anxiety    no meds   . Asthma    well controlled-exercise induced  . Complication of anesthesia   . Family history of adverse reaction to anesthesia    mom-n/v  . Headache    more frequent during the summer with her allergies  . Kidney stones   . PONV (postoperative nausea and vomiting)    nausea    There are no problems to display for this patient.   Past Surgical History:  Procedure Laterality Date  . CYSTOSCOPY    . LAPAROSCOPY N/A 04/14/2018   Procedure: LAPAROSCOPY DIAGNOSTIC;  Surgeon: Ouida Sills Gwen Her, MD;  Location: [facility] ORS;  Service: Gynecology;  Laterality: N/A;  . LAPAROSCOPY N/A 04/14/2018   Procedure: LAPAROSCOPY OPERATIVE;  Surgeon: Ouida Sills Gwen Her, MD;  Location: [facility] ORS;  Service: Gynecology;  Laterality: N/A;  . TONSILLECTOMY     age 62    Prior to Admission medications   Medication Sig Start Date End Date Taking? Authorizing Provider  albuterol (PROVENTIL HFA;VENTOLIN HFA) 108 (90 Base) MCG/ACT inhaler Inhale 2 puffs into the lungs every 6 (six) hours  as needed for wheezing or shortness of breath (PER MOM, PT CURRENTLY OUT OF INHALER AND WILL NEED TO GET ANOTHER RX).  10/07/16   [provider]  brompheniramine-pseudoephedrine-DM 30-2-10 MG/5ML syrup Take 5 mLs by mouth 4 (four) times daily as needed. 12/29/19   Sable Feil, PA-C  cetirizine (ZYRTEC) 10 MG tablet Take 10 mg by mouth at bedtime.     [provider]  dicyclomine (BENTYL) 20 MG tablet Take 1 tablet (20 mg total) by mouth 4 (four) times daily -  before meals and at bedtime for 5 days. 09/21/19 09/26/19  Duffy Bruce, MD  meloxicam (MOBIC) 7.5 MG tablet Take 1 tablet (7.5 mg total) by mouth daily for 7 days. 01/20/20 01/27/20  Laban Emperor, PA-C  methylPREDNISolone (MEDROL DOSEPAK) 4 MG TBPK tablet Take Tapered dose as directed 12/29/19   Sable Feil, PA-C  omeprazole (PRILOSEC) 20 MG capsule Take 1 capsule (20 mg total) by mouth daily for 10 days. 09/21/19 10/01/19  Duffy Bruce, MD  triamcinolone ointment (KENALOG) 0.1 % Apply 1 application topically 2 (two) times daily as needed (for eczema).  10/07/16   [provider]  valACYclovir (VALTREX) 500 MG tablet Take 500 mg by mouth daily as needed (for cold sores).  01/08/17   [provider]    Allergies Latex  History reviewed. No pertinent family history.  Social History Social History   Tobacco Use  .  Smoking status: Never Smoker  . Smokeless tobacco: Never Used  Substance Use Topics  . Alcohol use: No  . Drug use: Never     Review of Systems  Respiratory: No SOB. Gastrointestinal: No nausea, no vomiting.  Musculoskeletal: Positive for bilateral foot pain. Skin: Negative for rash, abrasions, lacerations, ecchymosis. Neurological: Negative for headaches, numbness or tingling   ____________________________________________   PHYSICAL EXAM:  VITAL SIGNS: ED Triage Vitals [01/20/20 0848]  Enc Vitals Group     BP 124/68     Pulse Rate (!) 55     Resp 18     Temp 98.4 F  (36.9 C)     Temp Source Oral     SpO2 100 %     Weight 162 lb (73.5 kg)     Height 5\' 3"  (1.6 m)     Head Circumference      Peak Flow      Pain Score 10     Pain Loc      Pain Edu?      Excl. in GC?      Constitutional: Alert and oriented. Well appearing and in no acute distress. Eyes: Conjunctivae are normal. PERRL. EOMI. Head: Atraumatic. ENT:      Ears:      Nose: No congestion/rhinnorhea.      Mouth/Throat: Mucous membranes are moist.  Neck: No stridor.   Cardiovascular: Normal rate, regular rhythm.  Good peripheral circulation.  Symmetric pedal pulses bilaterally. Respiratory: Normal respiratory effort without tachypnea or retractions. Lungs CTAB. Good air entry to the bases with no decreased or absent breath sounds. Musculoskeletal: Full range of motion to all extremities. No gross deformities appreciated.  Feet are warm to touch. Neurologic:  Normal speech and language. No gross focal neurologic deficits are appreciated.  Skin:  Skin is warm, dry and intact. No rash noted. Psychiatric: Mood and affect are normal. Speech and behavior are normal. Patient exhibits appropriate insight and judgement.   ____________________________________________   LABS (all labs ordered are listed, but only abnormal results are displayed)  Labs Reviewed - No data to display ____________________________________________  EKG   ____________________________________________  RADIOLOGY , personally viewed and evaluated these images (plain radiographs) as part of my medical decision making, as well as reviewing the written report by the radiologist.  Lexine Baton Venous Img Lower Bilateral  Result Date: 01/20/2020 CLINICAL DATA:  Lower extremity pain and edema. EXAM: BILATERAL LOWER EXTREMITY VENOUS DOPPLER ULTRASOUND TECHNIQUE: Gray-scale sonography with graded compression, as well as color Doppler and duplex ultrasound were performed to evaluate the lower extremity deep venous  systems from the level of the common femoral vein and including the common femoral, femoral, profunda femoral, popliteal and calf veins including the posterior tibial, peroneal and gastrocnemius veins when visible. The superficial great saphenous vein was also interrogated. Spectral Doppler was utilized to evaluate flow at rest and with distal augmentation maneuvers in the common femoral, femoral and popliteal veins. COMPARISON:  None. FINDINGS: RIGHT LOWER EXTREMITY Common Femoral Vein: No evidence of thrombus. Normal compressibility, respiratory phasicity and response to augmentation. Saphenofemoral Junction: No evidence of thrombus. Normal compressibility and flow on color Doppler imaging. Profunda Femoral Vein: No evidence of thrombus. Normal compressibility and flow on color Doppler imaging. Femoral Vein: No evidence of thrombus. Normal compressibility, respiratory phasicity and response to augmentation. Popliteal Vein: No evidence of thrombus. Normal compressibility, respiratory phasicity and response to augmentation. Calf Veins: No evidence of thrombus. Normal compressibility and flow on color Doppler imaging.  Superficial Great Saphenous Vein: No evidence of thrombus. Normal compressibility. Venous Reflux:  None. Other Findings: No evidence of superficial thrombophlebitis or abnormal fluid collection. LEFT LOWER EXTREMITY Common Femoral Vein: No evidence of thrombus. Normal compressibility, respiratory phasicity and response to augmentation. Saphenofemoral Junction: No evidence of thrombus. Normal compressibility and flow on color Doppler imaging. Profunda Femoral Vein: No evidence of thrombus. Normal compressibility and flow on color Doppler imaging. Femoral Vein: No evidence of thrombus. Normal compressibility, respiratory phasicity and response to augmentation. Popliteal Vein: No evidence of thrombus. Normal compressibility, respiratory phasicity and response to augmentation. Calf Veins: No evidence of  thrombus. Normal compressibility and flow on color Doppler imaging. Superficial Great Saphenous Vein: No evidence of thrombus. Normal compressibility. Venous Reflux:  None. Other Findings: No evidence of superficial thrombophlebitis or abnormal fluid collection. IMPRESSION: No evidence of deep venous thrombosis in either lower extremity. Electronically Signed   By: Irish Lack M.D.   On: 01/20/2020 11:14   DG Foot 2 Views Left  Result Date: 01/20/2020 CLINICAL DATA:  Bilateral foot pain for 2 years associated with standing. EXAM: LEFT FOOT - 2 VIEW COMPARISON:  None. FINDINGS: There is no evidence of fracture or dislocation. There is no evidence of arthropathy or other focal bone abnormality. Soft tissues are unremarkable. IMPRESSION: Negative. Electronically Signed   By: Marnee Spring M.D.   On: 01/20/2020 10:43   DG Foot 2 Views Right  Result Date: 01/20/2020 CLINICAL DATA:  Bilateral leg and ankle pain when standing for long time, symptoms for 2 years. EXAM: RIGHT FOOT - 2 VIEW COMPARISON:  None. FINDINGS: There is no evidence of fracture or dislocation. There is no evidence of arthropathy or other focal bone abnormality. Soft tissues are unremarkable. IMPRESSION: Negative. Electronically Signed   By: Marnee Spring M.D.   On: 01/20/2020 10:38    ____________________________________________    PROCEDURES  Procedure(s) performed:    Procedures    Medications - No data to display   ____________________________________________   INITIAL IMPRESSION / ASSESSMENT AND PLAN / ED COURSE  Pertinent labs & imaging results that were available during my care of the patient were reviewed by me and considered in my medical decision making (see chart for details).  Review of the [place] CSRS was performed in accordance of the [facility] prior to dispensing any controlled drugs.   Patient presented to emergency department for evaluation of chronic bilateral foot and ankle pain.  Vital signs and exam  are reassuring.  We discussed risks and benefits of imaging and patient would like to have imaging completed today.  X-rays are negative for acute bony abnormalities.  Patient will be discharged home with prescriptions for Mobic. Patient is to follow up with primary care and podiatry as directed. Patient is given ED precautions to return to the ED for any worsening or new symptoms.  AKOSUA CONSTANTINE was evaluated in Emergency Department on 01/20/2020 for the symptoms described in the history of present illness. She was evaluated in the context of the global COVID-19 pandemic, which necessitated consideration that the patient might be at risk for infection with the SARS-CoV-2 virus that causes COVID-19. Institutional protocols and algorithms that pertain to the evaluation of patients at risk for COVID-19 are in a state of rapid change based on information released by regulatory bodies including the CDC and federal and state organizations. These policies and algorithms were followed during the patient's care in the ED.   ____________________________________________  FINAL CLINICAL IMPRESSION(S) / ED DIAGNOSES  Final diagnoses:  Pain in both feet      NEW MEDICATIONS STARTED DURING THIS VISIT:  ED Discharge Orders         Ordered    meloxicam (MOBIC) 7.5 MG tablet  Daily     01/20/20 1138              This chart was dictated using voice recognition software/Dragon. Despite best efforts to proofread, errors can occur which can change the meaning. Any change was purely unintentional.    Enid Derry, PA-C 01/20/20 1512    Chesley Noon, MD 01/20/20 [phone]

## 2020-01-20 NOTE — ED Triage Notes (Signed)
Pt to ER with c/o leg and ankle pain especially when she stands on them for a long time.  States has had this problem for approximately 2 years, since she started working.

## 2020-02-24 DIAGNOSIS — B001 Herpesviral vesicular dermatitis: Secondary | ICD-10-CM

## 2020-02-25 ENCOUNTER — Ambulatory Visit: Payer: Medicaid Other | Admitting: Advanced Practice Midwife

## 2020-02-25 ENCOUNTER — Other Ambulatory Visit: Payer: Self-pay

## 2020-02-25 DIAGNOSIS — Z113 Encounter for screening for infections with a predominantly sexual mode of transmission: Secondary | ICD-10-CM

## 2020-02-25 DIAGNOSIS — E282 Polycystic ovarian syndrome: Secondary | ICD-10-CM

## 2020-02-25 DIAGNOSIS — T7412XA Child physical abuse, confirmed, initial encounter: Secondary | ICD-10-CM | POA: Insufficient documentation

## 2020-02-25 DIAGNOSIS — T7412XS Child physical abuse, confirmed, sequela: Secondary | ICD-10-CM

## 2020-02-25 DIAGNOSIS — E663 Overweight: Secondary | ICD-10-CM

## 2020-02-25 LAB — WET PREP FOR TRICH, YEAST, CLUE
Trichomonas Exam: POSITIVE — AB
Yeast Exam: NEGATIVE

## 2020-02-25 MED ORDER — METRONIDAZOLE 500 MG PO TABS: 2000.0000 mg | ORAL_TABLET | Freq: Once | ORAL | 0 refills | Status: AC
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
## 2020-02-25 NOTE — Progress Notes (Signed)
[employer] results reviewed. Patient treated for Trich per standing orders. LCSW A. Marvin Lexicographer given. Tawny Hopping, RN

## 2020-03-04 ENCOUNTER — Telehealth: Payer: Self-pay | Admitting: Family Medicine

## 2020-03-04 NOTE — Telephone Encounter (Signed)
Returned patient phone call. Patient requesting all TR from 02/25/2020 OV. RN verified correct chart password. Available TR verbally given. RN explained that not all test results are available and to allow 10-14 days for remaining test results to come in. Patient agreeable to above. Tawny Hopping, RN

## 2020-03-04 NOTE — Telephone Encounter (Signed)
Pt is calling to get results  °

## 2020-03-10 ENCOUNTER — Ambulatory Visit: Payer: Medicaid Other | Admitting: Licensed Clinical Social Worker

## 2020-03-24 ENCOUNTER — Ambulatory Visit: Payer: Self-pay

## 2020-03-29 ENCOUNTER — Emergency Department
Admission: EM | Admit: 2020-03-29 | Discharge: 2020-03-30 | Disposition: A | Discharge status: Home / Self Care | Payer: Medicaid Other | Attending: Emergency Medicine | Admitting: Emergency Medicine

## 2020-03-29 ENCOUNTER — Emergency Department: Payer: Medicaid Other

## 2020-03-29 DIAGNOSIS — R1030 Lower abdominal pain, unspecified: Secondary | ICD-10-CM

## 2020-03-29 DIAGNOSIS — R1031 Right lower quadrant pain: Secondary | ICD-10-CM | POA: Diagnosis present

## 2020-03-29 DIAGNOSIS — N390 Urinary tract infection, site not specified: Secondary | ICD-10-CM

## 2020-03-29 DIAGNOSIS — Z91040 Latex allergy status: Secondary | ICD-10-CM | POA: Diagnosis not present

## 2020-03-29 LAB — URINALYSIS, COMPLETE (UACMP) WITH MICROSCOPIC
Bilirubin Urine: NEGATIVE
Glucose, UA: NEGATIVE mg/dL
Ketones, ur: NEGATIVE mg/dL
Nitrite: NEGATIVE
Protein, ur: NEGATIVE mg/dL
Specific Gravity, Urine: 1.015 (ref 1.005–1.030)
pH: 5 (ref 5.0–8.0)

## 2020-03-29 LAB — COMPREHENSIVE METABOLIC PANEL
ALT: 27 U/L (ref 0–44)
AST: 24 U/L (ref 15–41)
Albumin: 4.5 g/dL (ref 3.5–5.0)
Alkaline Phosphatase: 74 U/L (ref 38–126)
Anion gap: 12 (ref 5–15)
BUN: 7 mg/dL (ref 6–20)
CO2: 26 mmol/L (ref 22–32)
Calcium: 9.1 mg/dL (ref 8.9–10.3)
Chloride: 101 mmol/L (ref 98–111)
Creatinine, Ser: 0.8 mg/dL (ref 0.44–1.00)
GFR calc Af Amer: >60 mL/min (ref >60)
GFR calc non Af Amer: >60 mL/min (ref >60)
Glucose, Bld: 68 mg/dL — ABNORMAL LOW (ref 70–99)
Potassium: 3.7 mmol/L (ref 3.5–5.1)
Sodium: 139 mmol/L (ref 135–145)
Total Bilirubin: 0.7 mg/dL (ref 0.3–1.2)
Total Protein: 7.6 g/dL (ref 6.5–8.1)

## 2020-03-29 LAB — CBC
HCT: 39.7 % (ref 36.0–46.0)
Hemoglobin: 14 g/dL (ref 12.0–15.0)
MCH: 31.7 pg (ref 26.0–34.0)
MCHC: 35.3 g/dL (ref 30.0–36.0)
MCV: 89.8 fL (ref 80.0–100.0)
Platelets: 298 10*3/uL (ref 150–400)
RBC: 4.42 MIL/uL (ref 3.87–5.11)
RDW: 11.9 % (ref 11.5–15.5)
WBC: 8.6 10*3/uL (ref 4.0–10.5)
nRBC: 0 % (ref 0.0–0.2)

## 2020-03-29 LAB — WET PREP, GENITAL
Clue Cells Wet Prep HPF POC: NONE SEEN
Sperm: NONE SEEN
Trich, Wet Prep: NONE SEEN
Yeast Wet Prep HPF POC: NONE SEEN

## 2020-03-29 LAB — POC URINE PREG, ED: Preg Test, Ur: NEGATIVE

## 2020-03-29 LAB — RAPID HIV SCREEN (HIV 1/2 AB+AG)
HIV 1/2 Antibodies: NONREACTIVE
HIV-1 P24 Antigen - HIV24: NONREACTIVE

## 2020-03-29 LAB — LIPASE, BLOOD: Lipase: 33 U/L (ref 11–51)

## 2020-03-29 MED ORDER — NITROFURANTOIN MONOHYD MACRO 100 MG PO CAPS: 100.0000 mg | ORAL_CAPSULE | Freq: Two times a day (BID) | ORAL | 0 refills | Status: AC

## 2020-03-29 NOTE — ED Triage Notes (Signed)
Pt to ED reporting lower abd pain x 1 week with intermittent nausea. Pt also having intermittent "cervical cramping" No new sexual partners, no vaginal bleeding and no vaginal discharge.

## 2020-03-29 NOTE — ED Notes (Signed)
Pt given swab/kit for self collection of wet prep per EDP Archie Balboa verbal order. Pt educated. Pt given specimen cup to collect new urine sample to send to lab as urine sent earlier today was a clean catch.
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
## 2020-03-29 NOTE — ED Notes (Signed)
Pt reports history of kidney stones. States sometimes burns when she urinates. Lower back pain. Pt denies history of UTI. Denies fevers/sweats/chills. Reports nausea. History of PCOS dx. Pt A&Ox4; resting calmly in bed.

## 2020-03-29 NOTE — Discharge Instructions (Addendum)
Please seek medical attention for any high fevers, chest pain, shortness of breath, change in behavior, persistent vomiting, bloody stool or any other new or concerning symptoms.  1. Numerous follicles within bilateral ovaries. Otherwise  unremarkable exam.

## 2020-03-30 ENCOUNTER — Telehealth: Payer: Self-pay | Admitting: General Practice

## 2020-03-30 LAB — CHLAMYDIA/NGC RT PCR (ARMC ONLY)
Chlamydia Tr: NOT DETECTED
N gonorrhoeae: NOT DETECTED

## 2020-03-30 NOTE — Telephone Encounter (Signed)
Individual has Medicaid and is ineligible to become a pt in our clinic.

## 2020-03-30 NOTE — ED Provider Notes (Signed)
1:34 AM Assumed care for off going team.   Blood pressure 108/63, pulse 84, temperature 98.3 F (36.8 C), temperature source Oral, resp. rate 16, SpO2 99 %.  See their HPI for full report but in brief     IMPRESSION:  1. Numerous follicles within bilateral ovaries. Otherwise  unremarkable exam.   Reevaluated patient updated on results that would be treating her for UTI.  Her STD testing came back negative.  Patient felt comfortable with this plan  I discussed the provisional nature of ED diagnosis, the treatment so far, the ongoing plan of care, follow up appointments and return precautions with the patient and any family or support people present. They expressed understanding and agreed with the plan, discharged home.        Concha Se, MD 03/30/20 [phone]

## 2020-05-03 ENCOUNTER — Ambulatory Visit: Payer: Medicaid Other

## 2020-05-09 ENCOUNTER — Encounter: Payer: Self-pay | Admitting: Emergency Medicine

## 2020-05-09 ENCOUNTER — Other Ambulatory Visit: Payer: Self-pay

## 2020-05-09 DIAGNOSIS — R102 Pelvic and perineal pain: Secondary | ICD-10-CM | POA: Diagnosis not present

## 2020-05-09 DIAGNOSIS — Z79899 Other long term (current) drug therapy: Secondary | ICD-10-CM | POA: Diagnosis not present

## 2020-05-09 DIAGNOSIS — N760 Acute vaginitis: Secondary | ICD-10-CM | POA: Insufficient documentation

## 2020-05-09 DIAGNOSIS — J45909 Unspecified asthma, uncomplicated: Secondary | ICD-10-CM | POA: Insufficient documentation

## 2020-05-09 DIAGNOSIS — Z91040 Latex allergy status: Secondary | ICD-10-CM | POA: Insufficient documentation

## 2020-05-09 DIAGNOSIS — E282 Polycystic ovarian syndrome: Secondary | ICD-10-CM | POA: Diagnosis not present

## 2020-05-09 LAB — CBC
HCT: 38.2 % (ref 36.0–46.0)
Hemoglobin: 13.4 g/dL (ref 12.0–15.0)
MCH: 32.1 pg (ref 26.0–34.0)
MCHC: 35.1 g/dL (ref 30.0–36.0)
MCV: 91.6 fL (ref 80.0–100.0)
Platelets: 244 10*3/uL (ref 150–400)
RBC: 4.17 MIL/uL (ref 3.87–5.11)
RDW: 11.9 % (ref 11.5–15.5)
WBC: 6.7 10*3/uL (ref 4.0–10.5)
nRBC: 0 % (ref 0.0–0.2)

## 2020-05-09 LAB — COMPREHENSIVE METABOLIC PANEL
ALT: 23 U/L (ref 0–44)
AST: 22 U/L (ref 15–41)
Albumin: 4.2 g/dL (ref 3.5–5.0)
Alkaline Phosphatase: 67 U/L (ref 38–126)
Anion gap: 10 (ref 5–15)
BUN: 8 mg/dL (ref 6–20)
CO2: 27 mmol/L (ref 22–32)
Calcium: 9.1 mg/dL (ref 8.9–10.3)
Chloride: 105 mmol/L (ref 98–111)
Creatinine, Ser: 0.61 mg/dL (ref 0.44–1.00)
GFR calc Af Amer: >60 mL/min (ref >60)
GFR calc non Af Amer: >60 mL/min (ref >60)
Glucose, Bld: 91 mg/dL (ref 70–99)
Potassium: 4 mmol/L (ref 3.5–5.1)
Sodium: 142 mmol/L (ref 135–145)
Total Bilirubin: 0.9 mg/dL (ref 0.3–1.2)
Total Protein: 7.2 g/dL (ref 6.5–8.1)

## 2020-05-09 LAB — POCT PREGNANCY, URINE: Preg Test, Ur: NEGATIVE

## 2020-05-09 LAB — URINALYSIS, COMPLETE (UACMP) WITH MICROSCOPIC
Bacteria, UA: NONE SEEN
Bilirubin Urine: NEGATIVE
Glucose, UA: NEGATIVE mg/dL
Ketones, ur: NEGATIVE mg/dL
Nitrite: NEGATIVE
Protein, ur: NEGATIVE mg/dL
Specific Gravity, Urine: 1.002 — ABNORMAL LOW (ref 1.005–1.030)
pH: 6 (ref 5.0–8.0)

## 2020-05-09 LAB — LIPASE, BLOOD: Lipase: 33 U/L (ref 11–51)

## 2020-05-09 NOTE — ED Triage Notes (Signed)
Pt presents to ED via POV with c/o RLQ abdominal pain. Pt states pain x 2 weeks. Pt states pain is described as a cramping pain.

## 2020-05-10 ENCOUNTER — Emergency Department: Payer: Medicaid Other

## 2020-05-10 ENCOUNTER — Emergency Department
Admission: EM | Admit: 2020-05-10 | Discharge: 2020-05-10 | Disposition: A | Discharge status: Home / Self Care | Payer: Medicaid Other | Attending: Emergency Medicine | Admitting: Emergency Medicine

## 2020-05-10 DIAGNOSIS — B9689 Other specified bacterial agents as the cause of diseases classified elsewhere: Secondary | ICD-10-CM

## 2020-05-10 DIAGNOSIS — R102 Pelvic and perineal pain: Secondary | ICD-10-CM

## 2020-05-10 DIAGNOSIS — E282 Polycystic ovarian syndrome: Secondary | ICD-10-CM

## 2020-05-10 LAB — WET PREP, GENITAL
Sperm: NONE SEEN
Trich, Wet Prep: NONE SEEN
Yeast Wet Prep HPF POC: NONE SEEN

## 2020-05-10 MED ORDER — METRONIDAZOLE 500 MG PO TABS: 500.0000 mg | ORAL_TABLET | Freq: Two times a day (BID) | ORAL | 0 refills | Status: AC

## 2020-05-10 NOTE — ED Triage Notes (Signed)
Emergency Medicine Provider Triage Evaluation Note  Tiffany Bush , a 19 y.o. female  was evaluated in triage.  Pt complains of bilateral lower abdominal cramping for 1-2 weeks.  Review of Systems  Positive: Abdominal pain, dysuria, nausea, vaginal discharge. Negative: Vomiting, diarrhea, chest pain, shortness of breath, fever  Physical Exam  BP (!) 114/54 (BP Location: Left Arm)   Pulse (!) 54   Temp 98.3 F (36.8 C) (Oral)   Resp 18   Ht 5\' 3"  (1.6 m)   Wt 73.5 kg   SpO2 97%   BMI 28.70 kg/m  Gen:   Awake, no distress   HEENT:  Atraumatic  Resp:  Normal effort  Cardiac:  Normal rate  Abd:   Nondistended, nontender MSK:   Moves extremities without difficulty  Neuro:  Speech clear   Medical Decision Making  Medically screening exam initiated at 1:52 AM.  Appropriate orders placed.  ALANTE WEIMANN was informed that the remainder of the evaluation will be completed by another provider, this initial triage assessment does not replace that evaluation, and the importance of remaining in the ED until their evaluation is complete.  Clinical Impression   Doubt appendicitis as no localizing abdominal tenderness, symptoms seem more likely from GU pathology given dysuria and vaginal discharge. Will further assess with TV [name] and wet prep, labs and UA unremarkable.   [place], MD 05/10/20 [phone]

## 2020-05-10 NOTE — ED Notes (Signed)
Patient discharged to home per MD order. Patient in stable condition, and deemed medically cleared by ED provider for discharge. Discharge instructions reviewed with patient/family using "Teach Back"; verbalized understanding of medication education and administration, and information about follow-up care. Denies further concerns. ° °
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
## 2020-06-29 ENCOUNTER — Encounter: Payer: Self-pay | Admitting: Emergency Medicine

## 2020-06-29 ENCOUNTER — Other Ambulatory Visit: Payer: Self-pay

## 2020-06-29 DIAGNOSIS — Z20822 Contact with and (suspected) exposure to covid-19: Secondary | ICD-10-CM | POA: Insufficient documentation

## 2020-06-29 DIAGNOSIS — R1032 Left lower quadrant pain: Secondary | ICD-10-CM | POA: Diagnosis present

## 2020-06-29 DIAGNOSIS — A749 Chlamydial infection, unspecified: Secondary | ICD-10-CM | POA: Diagnosis not present

## 2020-06-29 DIAGNOSIS — J45909 Unspecified asthma, uncomplicated: Secondary | ICD-10-CM | POA: Insufficient documentation

## 2020-06-29 DIAGNOSIS — Z91040 Latex allergy status: Secondary | ICD-10-CM | POA: Diagnosis not present

## 2020-06-29 LAB — CBC
HCT: 38.2 % (ref 36.0–46.0)
Hemoglobin: 13.5 g/dL (ref 12.0–15.0)
MCH: 32.1 pg (ref 26.0–34.0)
MCHC: 35.3 g/dL (ref 30.0–36.0)
MCV: 90.7 fL (ref 80.0–100.0)
Platelets: 215 10*3/uL (ref 150–400)
RBC: 4.21 MIL/uL (ref 3.87–5.11)
RDW: 11.9 % (ref 11.5–15.5)
WBC: 14.7 10*3/uL — ABNORMAL HIGH (ref 4.0–10.5)
nRBC: 0.1 % (ref 0.0–0.2)

## 2020-06-29 LAB — POCT PREGNANCY, URINE: Preg Test, Ur: NEGATIVE

## 2020-06-29 NOTE — ED Triage Notes (Signed)
Pt to ED via EMS from home c/o LLQ abd pain, sore throat, congestion x4 days.  Nausea but no vomiting or diarrhea.  States irregular periods but last one 1-2 months ago.  States minor non productive cough.  Pt chest rise even and unlabored, skin warm and dry, in NAD at this time.

## 2020-06-29 NOTE — ED Triage Notes (Signed)
EMS brought pt in from home for "flu-like illness x 4 days",

## 2020-06-30 ENCOUNTER — Emergency Department
Admission: EM | Admit: 2020-06-30 | Discharge: 2020-06-30 | Disposition: A | Discharge status: Home / Self Care | Payer: Medicaid Other | Attending: Emergency Medicine | Admitting: Emergency Medicine

## 2020-06-30 DIAGNOSIS — R1032 Left lower quadrant pain: Secondary | ICD-10-CM

## 2020-06-30 DIAGNOSIS — A749 Chlamydial infection, unspecified: Secondary | ICD-10-CM

## 2020-06-30 LAB — RESPIRATORY PANEL BY RT PCR (FLU A&B, COVID)
Influenza A by PCR: NEGATIVE
Influenza B by PCR: NEGATIVE
SARS Coronavirus 2 by RT PCR: NEGATIVE

## 2020-06-30 LAB — COMPREHENSIVE METABOLIC PANEL
ALT: 18 U/L (ref 0–44)
AST: 20 U/L (ref 15–41)
Albumin: 3.4 g/dL — ABNORMAL LOW (ref 3.5–5.0)
Alkaline Phosphatase: 64 U/L (ref 38–126)
Anion gap: 8 (ref 5–15)
BUN: 7 mg/dL (ref 6–20)
CO2: 27 mmol/L (ref 22–32)
Calcium: 8.5 mg/dL — ABNORMAL LOW (ref 8.9–10.3)
Chloride: 106 mmol/L (ref 98–111)
Creatinine, Ser: 0.65 mg/dL (ref 0.44–1.00)
GFR, Estimated: >60 mL/min (ref >60)
Glucose, Bld: 95 mg/dL (ref 70–99)
Potassium: 3.7 mmol/L (ref 3.5–5.1)
Sodium: 141 mmol/L (ref 135–145)
Total Bilirubin: 0.6 mg/dL (ref 0.3–1.2)
Total Protein: 5.7 g/dL — ABNORMAL LOW (ref 6.5–8.1)

## 2020-06-30 LAB — URINALYSIS, COMPLETE (UACMP) WITH MICROSCOPIC
Bilirubin Urine: NEGATIVE
Glucose, UA: NEGATIVE mg/dL
Hgb urine dipstick: NEGATIVE
Ketones, ur: NEGATIVE mg/dL
Nitrite: NEGATIVE
Protein, ur: NEGATIVE mg/dL
Specific Gravity, Urine: 1.014 (ref 1.005–1.030)
pH: 6 (ref 5.0–8.0)

## 2020-06-30 LAB — LIPASE, BLOOD: Lipase: 31 U/L (ref 11–51)

## 2020-06-30 LAB — CHLAMYDIA/NGC RT PCR (ARMC ONLY)
Chlamydia Tr: DETECTED — AB
N gonorrhoeae: NOT DETECTED

## 2020-06-30 MED ORDER — DOXYCYCLINE HYCLATE 50 MG PO CAPS: 100.0000 mg | ORAL_CAPSULE | Freq: Two times a day (BID) | ORAL | 0 refills | Status: AC
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
## 2020-07-01 ENCOUNTER — Telehealth: Payer: Self-pay | Admitting: Emergency Medicine

## 2020-07-01 NOTE — Telephone Encounter (Signed)
Called patient to inform of need for 2 additional days of doxycycline treatement (per dr Cyril Loosen.)  She understands that she needs to pick up the additional medication at [facility] [place].

## 2020-07-04 ENCOUNTER — Encounter: Payer: Self-pay | Admitting: Physician Assistant

## 2020-07-04 ENCOUNTER — Ambulatory Visit: Payer: Medicaid Other | Admitting: Physician Assistant

## 2020-07-04 ENCOUNTER — Other Ambulatory Visit: Payer: Self-pay

## 2020-07-04 DIAGNOSIS — Z113 Encounter for screening for infections with a predominantly sexual mode of transmission: Secondary | ICD-10-CM | POA: Diagnosis not present

## 2020-07-04 DIAGNOSIS — A5602 Chlamydial vulvovaginitis: Secondary | ICD-10-CM

## 2020-07-04 MED ORDER — AZITHROMYCIN 500 MG PO TABS
1000.0000 mg | ORAL_TABLET | Freq: Once | ORAL | Status: AC
  Administered 2020-07-04: 1000 mg via ORAL

## 2020-07-04 NOTE — Progress Notes (Signed)
Pt treated for Chlamydia with Azithromcyin 1g po DOT per provider order. Counseled pt per provider orders and pt states understanding. Pt received [employer] with contact info per pt request and per provider verbal order. Provider orders completed.

## 2020-07-04 NOTE — Progress Notes (Signed)
Pt is here as she reports tested positive for Chlamydia at [facility] ER. Pt reports didn't complete medication.

## 2020-07-06 ENCOUNTER — Encounter: Payer: Self-pay | Admitting: Physician Assistant
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
## 2020-07-19 ENCOUNTER — Ambulatory Visit: Payer: Medicaid Other

## 2021-01-01 ENCOUNTER — Emergency Department
Admission: EM | Admit: 2021-01-01 | Discharge: 2021-01-01 | Disposition: A | Discharge status: Home / Self Care | Payer: Medicaid Other | Attending: Emergency Medicine | Admitting: Emergency Medicine

## 2021-01-01 DIAGNOSIS — R519 Headache, unspecified: Secondary | ICD-10-CM | POA: Insufficient documentation

## 2021-01-01 DIAGNOSIS — Y92410 Unspecified street and highway as the place of occurrence of the external cause: Secondary | ICD-10-CM | POA: Diagnosis not present

## 2021-01-01 DIAGNOSIS — M542 Cervicalgia: Secondary | ICD-10-CM | POA: Insufficient documentation

## 2021-01-01 DIAGNOSIS — M549 Dorsalgia, unspecified: Secondary | ICD-10-CM | POA: Diagnosis not present

## 2021-01-01 DIAGNOSIS — Z5321 Procedure and treatment not carried out due to patient leaving prior to being seen by health care provider: Secondary | ICD-10-CM | POA: Diagnosis not present

## 2021-01-01 NOTE — ED Notes (Signed)
Pt states she is going outside.
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
## 2021-01-01 NOTE — ED Notes (Signed)
Mother into lobby states to this rn:we're leaving, going to a better hospital and immediately walked away from this RN. Pt has previously signed Management consultant in triage.

## 2021-08-22 ENCOUNTER — Emergency Department
Admission: EM | Admit: 2021-08-22 | Discharge: 2021-08-22 | Disposition: A | Discharge status: Home / Self Care | Payer: Medicaid Other | Attending: Emergency Medicine | Admitting: Emergency Medicine

## 2021-08-22 ENCOUNTER — Encounter: Payer: Self-pay | Admitting: Emergency Medicine

## 2021-08-22 ENCOUNTER — Other Ambulatory Visit: Payer: Self-pay

## 2021-08-22 DIAGNOSIS — N72 Inflammatory disease of cervix uteri: Secondary | ICD-10-CM | POA: Diagnosis not present

## 2021-08-22 DIAGNOSIS — J45909 Unspecified asthma, uncomplicated: Secondary | ICD-10-CM | POA: Insufficient documentation

## 2021-08-22 DIAGNOSIS — Z91040 Latex allergy status: Secondary | ICD-10-CM | POA: Insufficient documentation

## 2021-08-22 DIAGNOSIS — N898 Other specified noninflammatory disorders of vagina: Secondary | ICD-10-CM | POA: Diagnosis present

## 2021-08-22 DIAGNOSIS — S39012A Strain of muscle, fascia and tendon of lower back, initial encounter: Secondary | ICD-10-CM

## 2021-08-22 LAB — URINALYSIS, ROUTINE W REFLEX MICROSCOPIC
Bacteria, UA: NONE SEEN
Bilirubin Urine: NEGATIVE
Glucose, UA: NEGATIVE mg/dL
Hgb urine dipstick: NEGATIVE
Ketones, ur: NEGATIVE mg/dL
Nitrite: NEGATIVE
Protein, ur: NEGATIVE mg/dL
Specific Gravity, Urine: 1.014 (ref 1.005–1.030)
pH: 6 (ref 5.0–8.0)

## 2021-08-22 LAB — LIPASE, BLOOD: Lipase: 42 U/L (ref 11–51)

## 2021-08-22 LAB — CBC WITH DIFFERENTIAL/PLATELET
Abs Immature Granulocytes: 0.02 10*3/uL (ref 0.00–0.07)
Basophils Absolute: 0.1 10*3/uL (ref 0.0–0.1)
Basophils Relative: 1 %
Eosinophils Absolute: 0.2 10*3/uL (ref 0.0–0.5)
Eosinophils Relative: 2 %
HCT: 40.8 % (ref 36.0–46.0)
Hemoglobin: 14 g/dL (ref 12.0–15.0)
Immature Granulocytes: 0 %
Lymphocytes Relative: 35 %
Lymphs Abs: 3 10*3/uL (ref 0.7–4.0)
MCH: 31.3 pg (ref 26.0–34.0)
MCHC: 34.3 g/dL (ref 30.0–36.0)
MCV: 91.3 fL (ref 80.0–100.0)
Monocytes Absolute: 0.5 10*3/uL (ref 0.1–1.0)
Monocytes Relative: 6 %
Neutro Abs: 4.9 10*3/uL (ref 1.7–7.7)
Neutrophils Relative %: 56 %
Platelets: 265 10*3/uL (ref 150–400)
RBC: 4.47 MIL/uL (ref 3.87–5.11)
RDW: 12 % (ref 11.5–15.5)
WBC: 8.6 10*3/uL (ref 4.0–10.5)
nRBC: 0 % (ref 0.0–0.2)

## 2021-08-22 LAB — CHLAMYDIA/NGC RT PCR (ARMC ONLY)
Chlamydia Tr: NOT DETECTED
N gonorrhoeae: NOT DETECTED

## 2021-08-22 LAB — COMPREHENSIVE METABOLIC PANEL
ALT: 33 U/L (ref 0–44)
AST: 31 U/L (ref 15–41)
Albumin: 3.5 g/dL (ref 3.5–5.0)
Alkaline Phosphatase: 68 U/L (ref 38–126)
Anion gap: 5 (ref 5–15)
BUN: 7 mg/dL (ref 6–20)
CO2: 28 mmol/L (ref 22–32)
Calcium: 8.6 mg/dL — ABNORMAL LOW (ref 8.9–10.3)
Chloride: 106 mmol/L (ref 98–111)
Creatinine, Ser: 0.65 mg/dL (ref 0.44–1.00)
GFR, Estimated: >60 mL/min (ref >60)
Glucose, Bld: 108 mg/dL — ABNORMAL HIGH (ref 70–99)
Potassium: 4.7 mmol/L (ref 3.5–5.1)
Sodium: 139 mmol/L (ref 135–145)
Total Bilirubin: 0.5 mg/dL (ref 0.3–1.2)
Total Protein: 6.1 g/dL — ABNORMAL LOW (ref 6.5–8.1)

## 2021-08-22 LAB — POC URINE PREG, ED: Preg Test, Ur: NEGATIVE

## 2021-08-22 LAB — WET PREP, GENITAL
Clue Cells Wet Prep HPF POC: NONE SEEN
Trich, Wet Prep: NONE SEEN
WBC, Wet Prep HPF POC: >=10 — AB (ref <10)
Yeast Wet Prep HPF POC: NONE SEEN

## 2021-08-22 MED ORDER — CYCLOBENZAPRINE HCL 5 MG PO TABS: 5.0000 mg | ORAL_TABLET | Freq: Three times a day (TID) | ORAL | 0 refills | Status: DC | PRN

## 2021-08-22 NOTE — ED Notes (Signed)
Patient discharged to home per MD order. Patient in stable condition, and deemed medically cleared by ED provider for discharge. Discharge instructions reviewed with patient/family using "Teach Back"; verbalized understanding of medication education and administration, and information about follow-up care. Denies further concerns. ° °
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
## 2021-08-22 NOTE — ED Triage Notes (Signed)
Patient ambulatory to triage with steady gait, without difficulty or distress noted; pt reports vag spotting with back pain and lower abd pain; hx BV and irregular periods

## 2021-10-08 IMAGING — CR DG CHEST 2V
1 series · 2 of 2 positions shown · non-contrast
Comparison: 04/08/2013

CLINICAL DATA: Cough

EXAM:
CHEST - 2 VIEW

[Series 1: dg chest 2 view · 0.14mm/px · 2 of 2 slices shown]
[im 1/2]
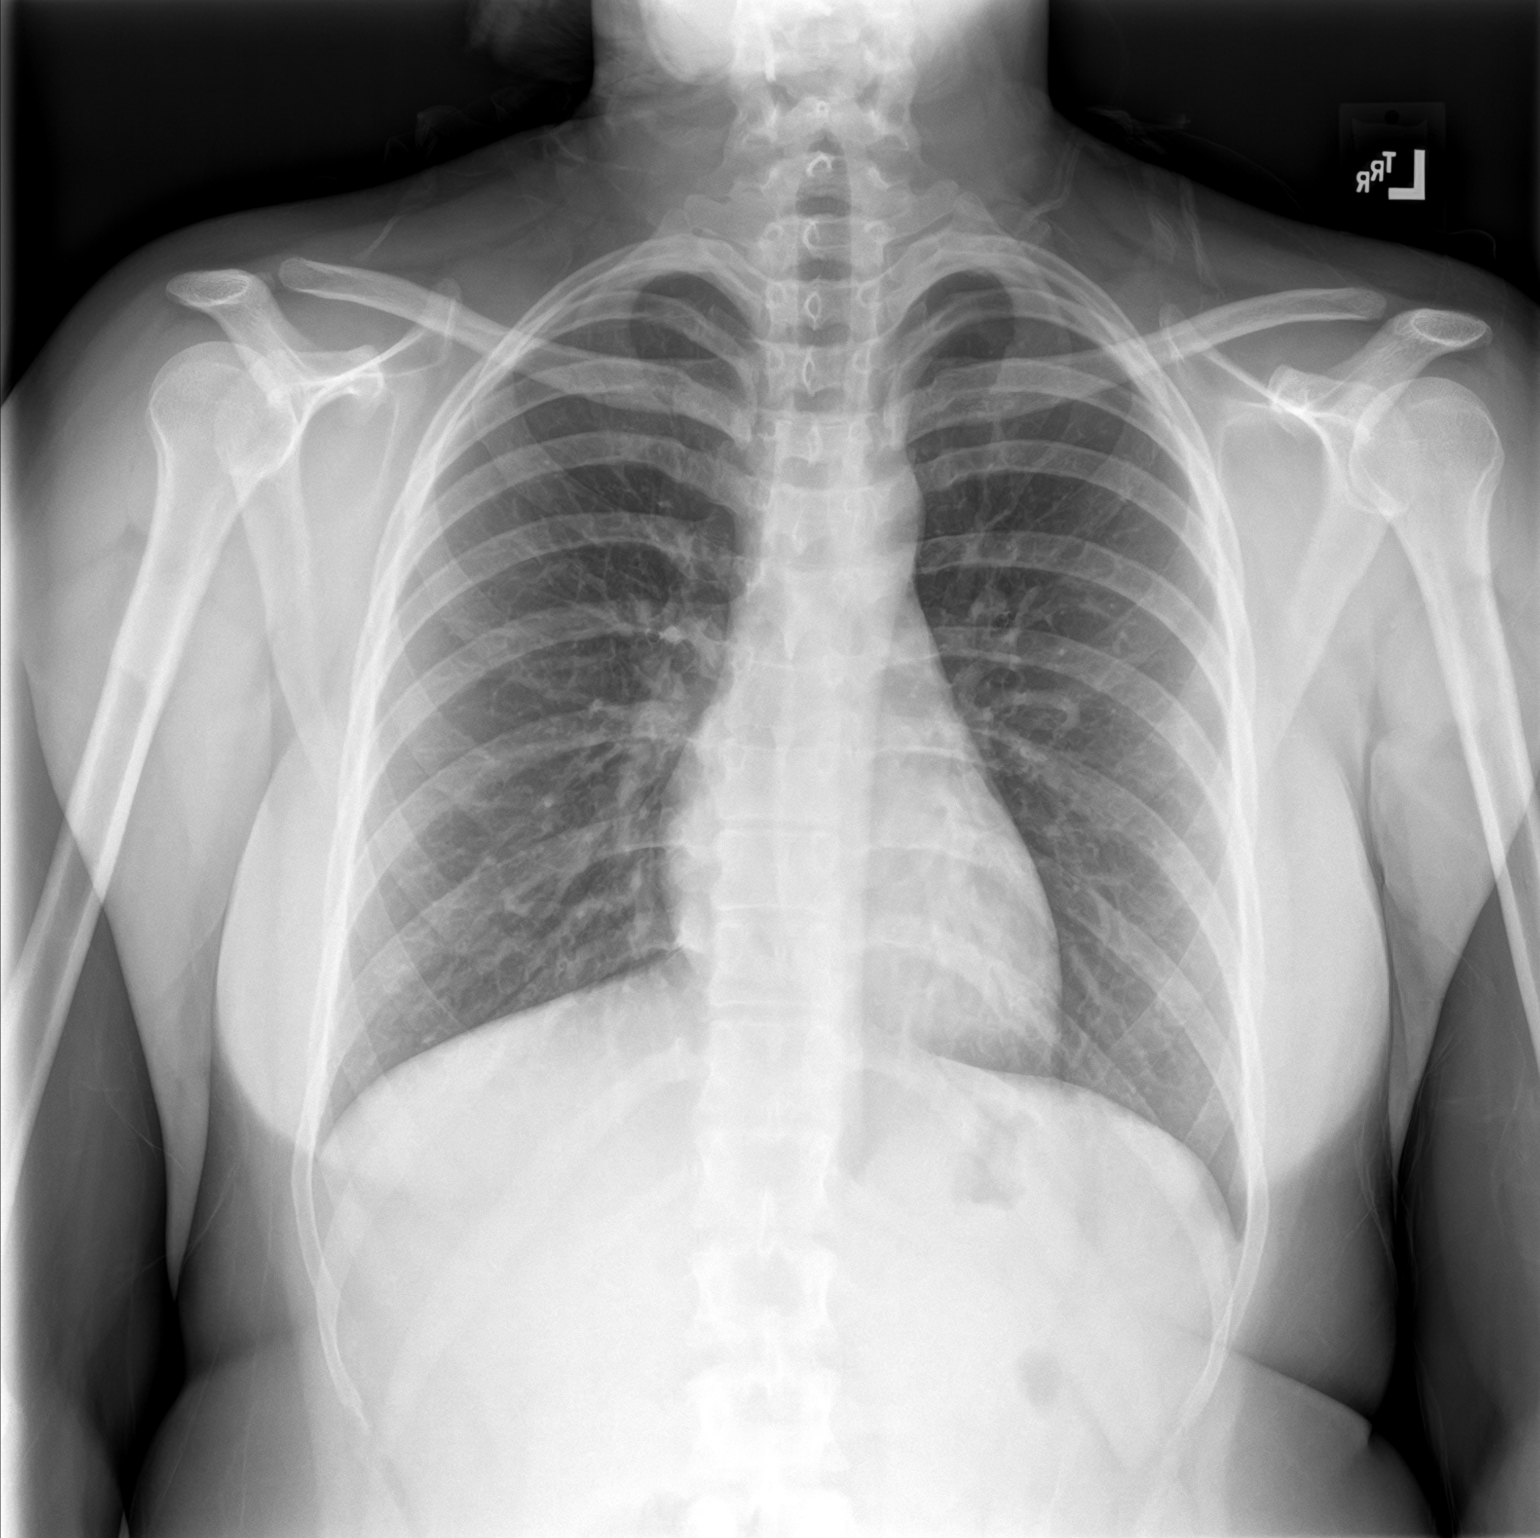
[im 2/2]
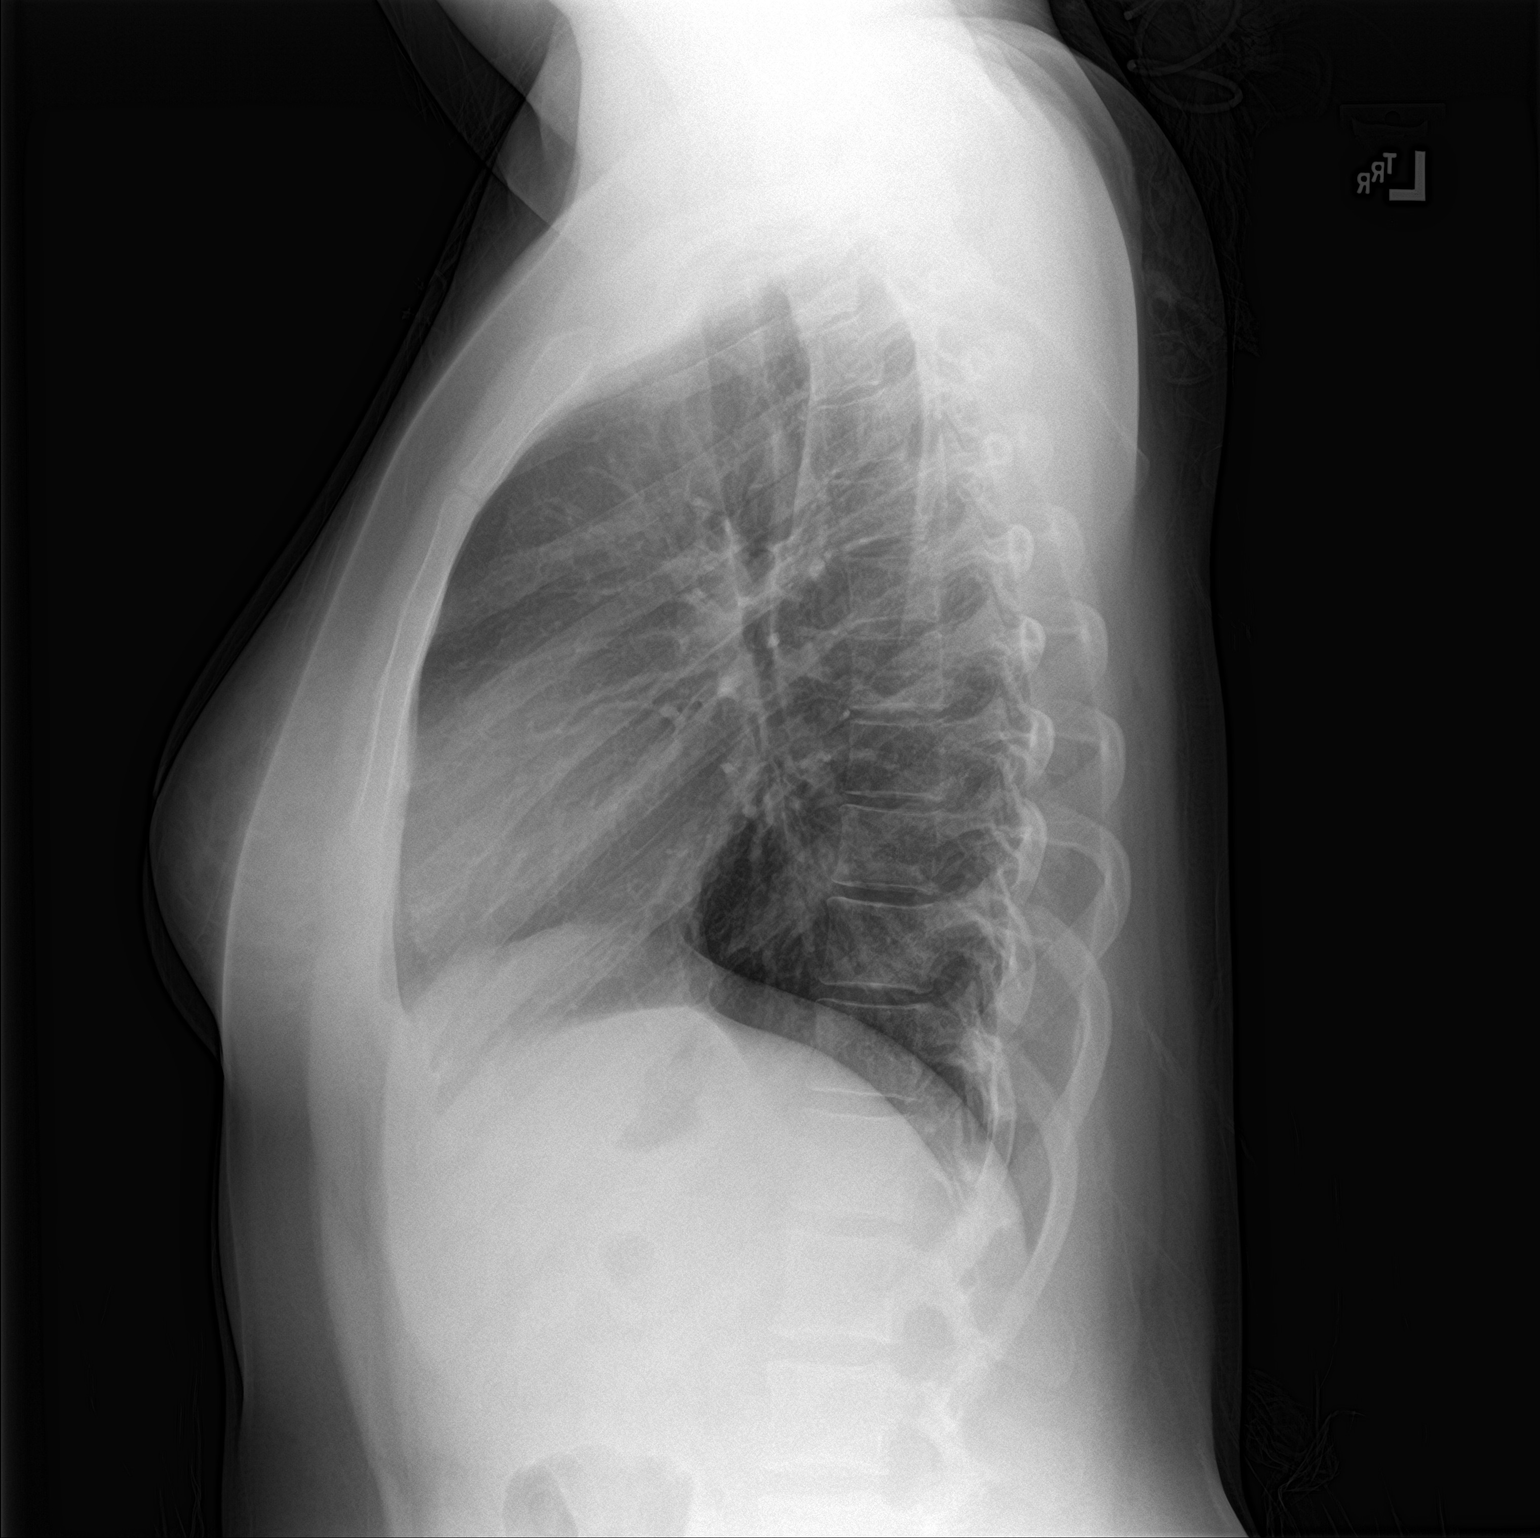

[2 of 2 positions shown; findings below may reference images not displayed]

FINDINGS: The heart size and mediastinal contours are within normal limits.
Both lungs are clear. The visualized skeletal structures are
unremarkable.
IMPRESSION: No active cardiopulmonary disease.

## 2021-10-30 IMAGING — US US EXTREM LOW VENOUS
1 series · 13 of 24 positions shown · non-contrast
Comparison: None.

CLINICAL DATA: Lower extremity pain and edema.



[Series 1: us venous img lower bilat (dvt) · portal-venous · 13 of 58 slices shown]
[im 1/58]
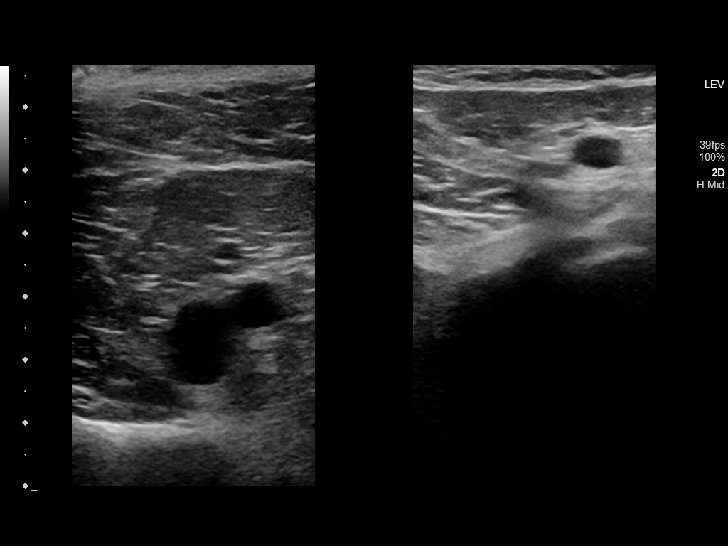
[im 5/58]
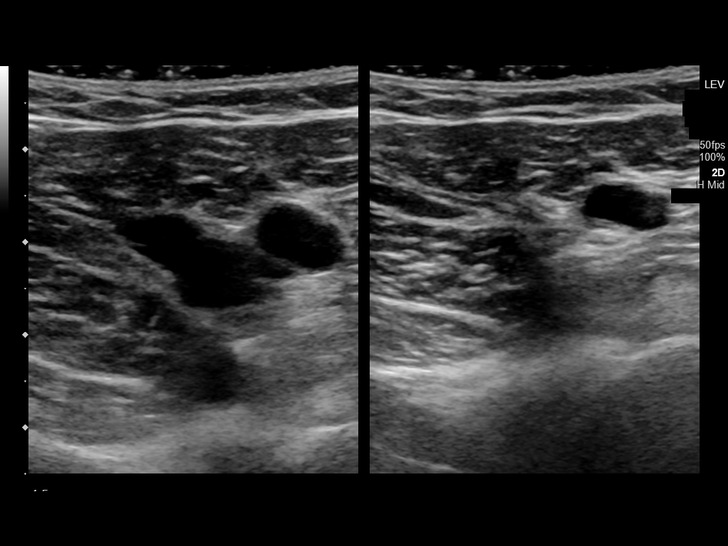
[im 10/58]
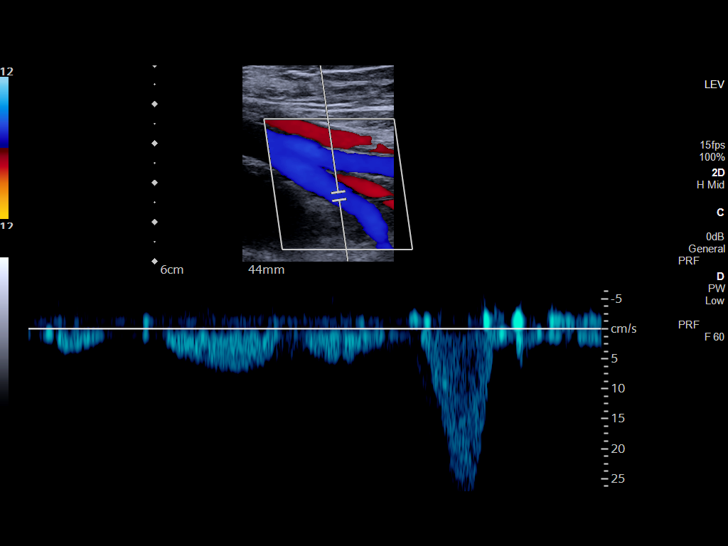
[im 15/58]
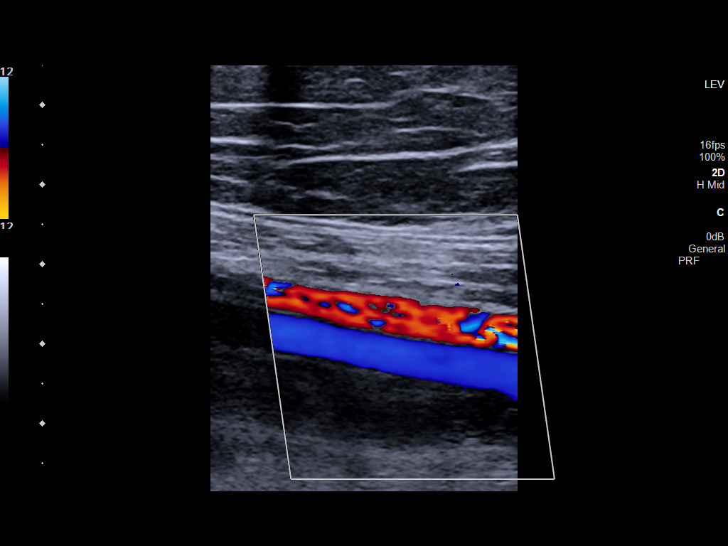
[im 20/58]
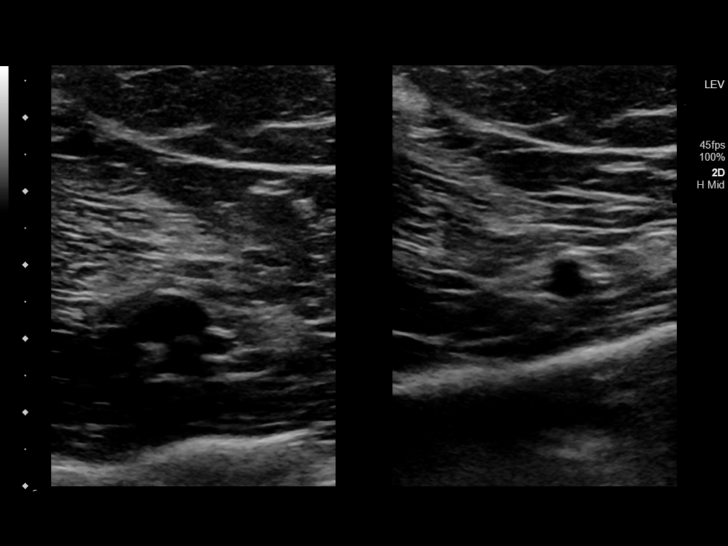
[im 25/58]
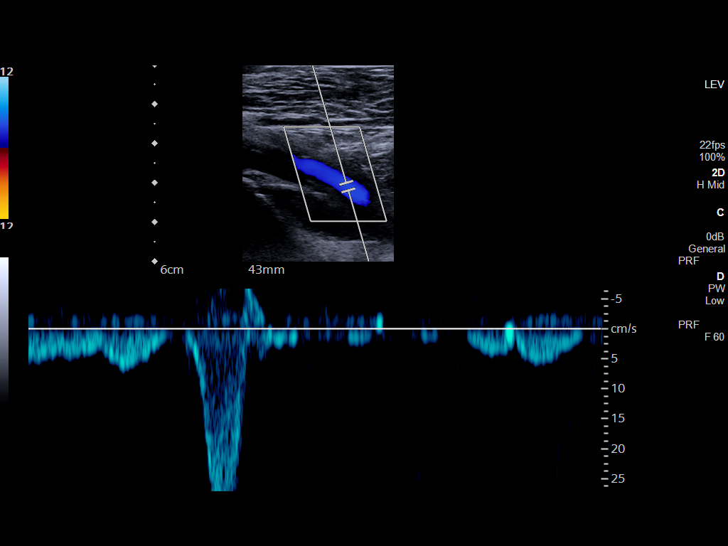
[im 30/58]
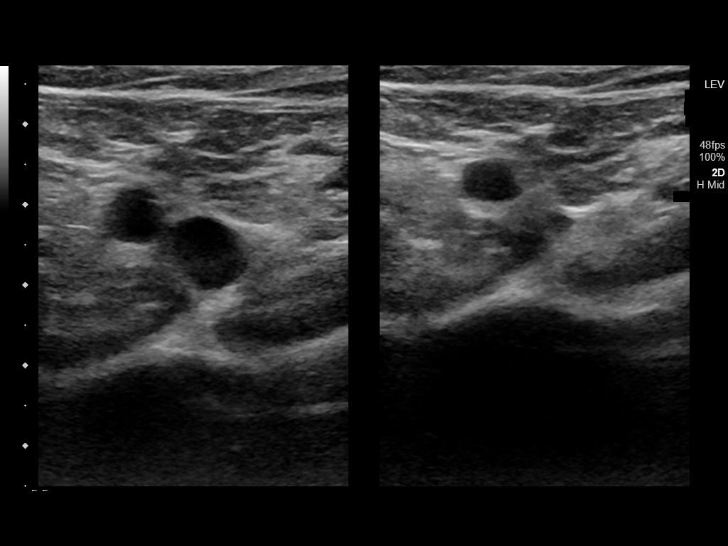
[im 33/58]
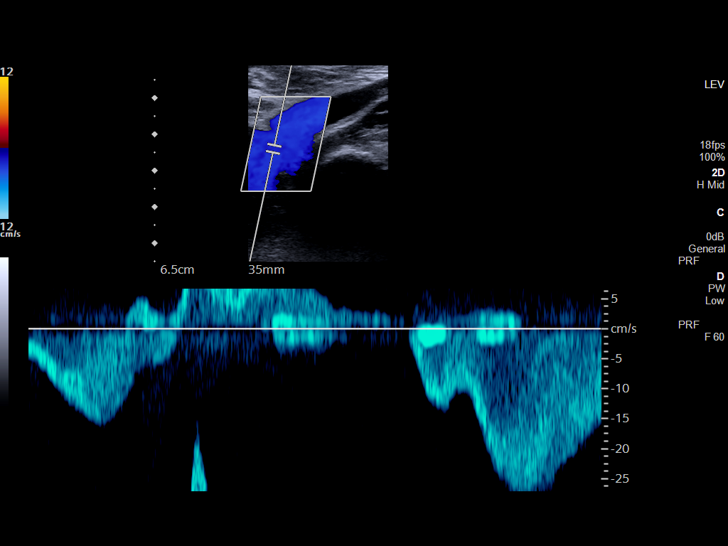
[im 38/58]
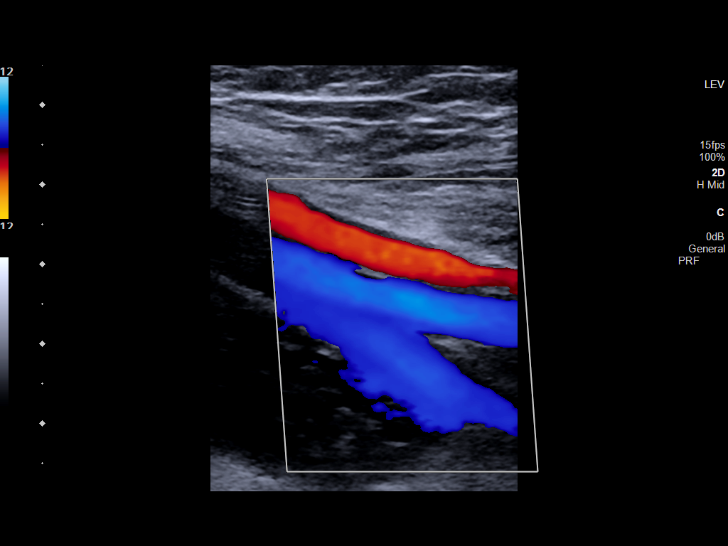
[im 43/58]
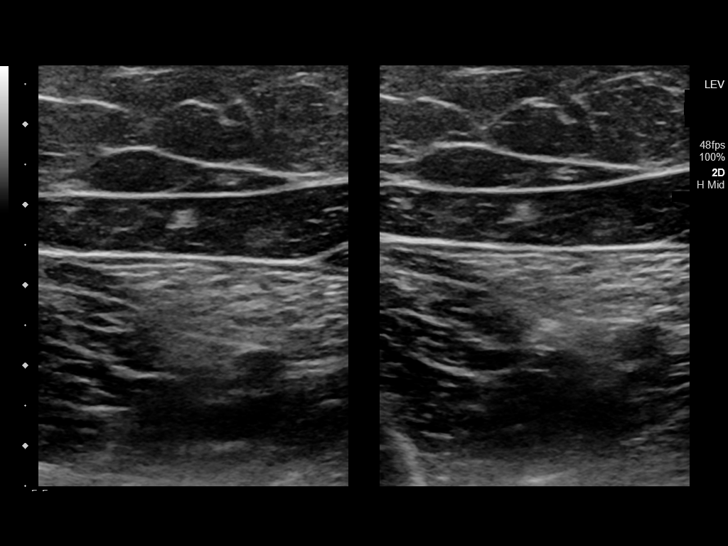
[im 48/58]
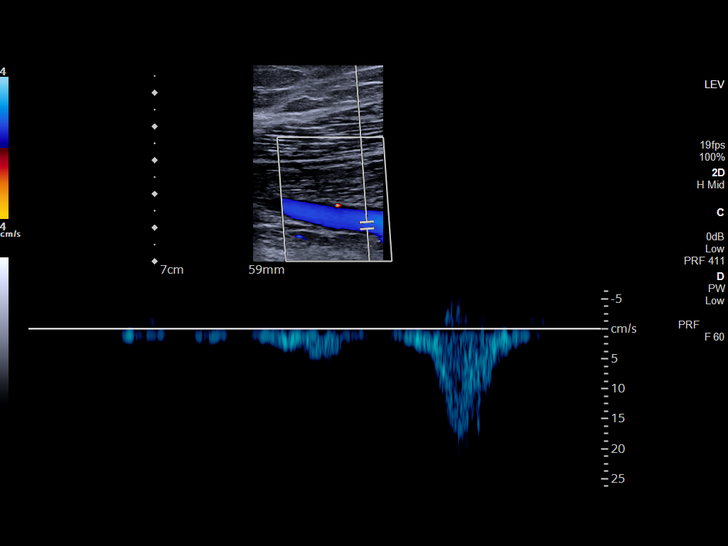
[im 53/58]
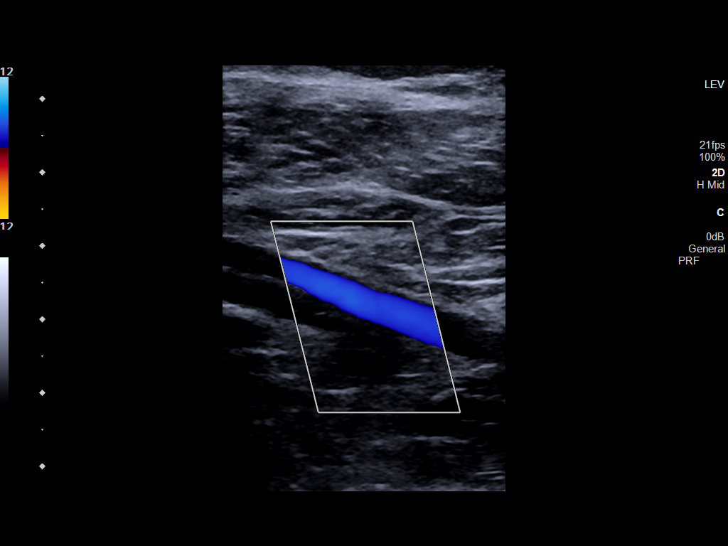
[im 58/58]
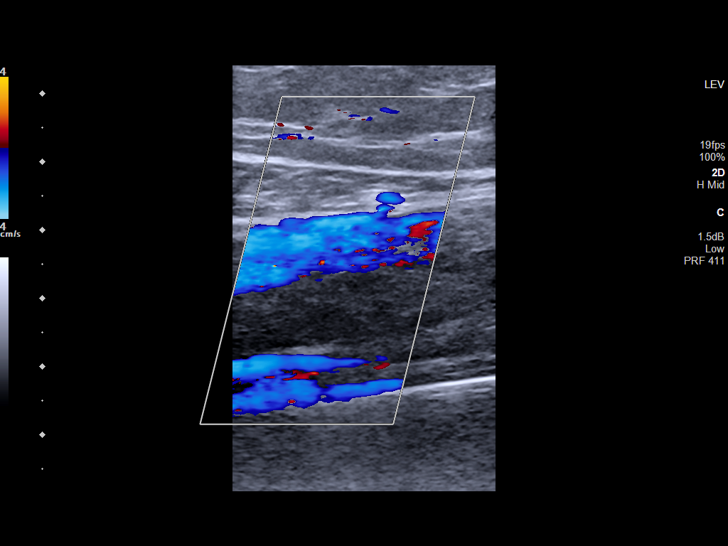

[13 of 24 positions shown; findings below may reference images not displayed]

FINDINGS: RIGHT LOWER EXTREMITY

Common Femoral Vein: No evidence of thrombus. Normal
compressibility, respiratory phasicity and response to augmentation.

Saphenofemoral Junction: No evidence of thrombus. Normal
compressibility and flow on color Doppler imaging.

Profunda Femoral Vein: No evidence of thrombus. Normal
compressibility and flow on color Doppler imaging.

Femoral Vein: No evidence of thrombus. Normal compressibility,
respiratory phasicity and response to augmentation.

Popliteal Vein: No evidence of thrombus. Normal compressibility,
respiratory phasicity and response to augmentation.

Calf Veins: No evidence of thrombus. Normal compressibility and flow
on color Doppler imaging.

Superficial Great Saphenous Vein: No evidence of thrombus. Normal
compressibility.

Venous Reflux:  None.

Other Findings: No evidence of superficial thrombophlebitis or
abnormal fluid collection.

LEFT LOWER EXTREMITY

Common Femoral Vein: No evidence of thrombus. Normal
compressibility, respiratory phasicity and response to augmentation.

Saphenofemoral Junction: No evidence of thrombus. Normal
compressibility and flow on color Doppler imaging.

Profunda Femoral Vein: No evidence of thrombus. Normal
compressibility and flow on color Doppler imaging.

Femoral Vein: No evidence of thrombus. Normal compressibility,
respiratory phasicity and response to augmentation.

Popliteal Vein: No evidence of thrombus. Normal compressibility,
respiratory phasicity and response to augmentation.

Calf Veins: No evidence of thrombus. Normal compressibility and flow
on color Doppler imaging.

Superficial Great Saphenous Vein: No evidence of thrombus. Normal
compressibility.

Venous Reflux:  None.

Other Findings: No evidence of superficial thrombophlebitis or
abnormal fluid collection.
IMPRESSION: No evidence of deep venous thrombosis in either lower extremity.

## 2021-12-18 ENCOUNTER — Ambulatory Visit: Payer: Medicaid Other | Admitting: Internal Medicine

## 2021-12-18 NOTE — Progress Notes (Deleted)
HPI ? ?Patient presents to clinic today to establish care and for management of the conditions listed below. ? ?PCOS: Managed on metformin.  She follows with OB/GYN. ? ?Anxiety: She is not currently taking medications for this.  She is not currently seeing a therapist.  She denies depression, SI/HI. ? ?HSV-1: Managed with Valacyclovir as needed. ? ?Past Medical History:  ?Diagnosis Date  ? Anxiety   ? no meds   ? Asthma   ? well controlled-exercise induced  ? Complication of anesthesia   ? Family history of adverse reaction to anesthesia   ? mom-n/v  ? Headache   ? more frequent during the summer with her allergies  ? Kidney stones   ? PONV (postoperative nausea and vomiting)   ? nausea  ? ? ?Current Outpatient Medications  ?Medication Sig Dispense Refill  ? albuterol (PROVENTIL HFA;VENTOLIN HFA) 108 (90 Base) MCG/ACT inhaler Inhale 2 puffs into the lungs every 6 (six) hours as needed for wheezing or shortness of breath (PER MOM, PT CURRENTLY OUT OF INHALER AND WILL NEED TO GET ANOTHER RX).     ? brompheniramine-pseudoephedrine-DM 30-2-10 MG/5ML syrup Take 5 mLs by mouth 4 (four) times daily as needed. 120 mL 0  ? cetirizine (ZYRTEC) 10 MG tablet Take 10 mg by mouth at bedtime.     ? cyclobenzaprine (FLEXERIL) 5 MG tablet Take 1 tablet (5 mg total) by mouth 3 (three) times daily as needed. 12 tablet 0  ? dicyclomine (BENTYL) 20 MG tablet Take 1 tablet (20 mg total) by mouth 4 (four) times daily -  before meals and at bedtime for 5 days. 20 tablet 0  ? levonorgestrel-ethinyl estradiol (SEASONALE) 0.15-0.03 MG tablet Take 1 tablet by mouth daily.    ? medroxyPROGESTERone (PROVERA) 10 MG tablet Take 1 tablet daily x 10 days to trigger period    ? meloxicam (MOBIC) 7.5 MG tablet Take 7.5 mg by mouth daily.    ? metFORMIN (GLUCOPHAGE) 500 MG tablet Take 500 mg by mouth 3 (three) times daily.    ? methylPREDNISolone (MEDROL DOSEPAK) 4 MG TBPK tablet Take Tapered dose as directed 21 tablet 0  ? omeprazole (PRILOSEC) 20 MG  capsule Take 1 capsule (20 mg total) by mouth daily for 10 days. 10 capsule 0  ? ondansetron (ZOFRAN) 8 MG tablet Take by mouth.    ? triamcinolone ointment (KENALOG) 0.1 % Apply 1 application topically 2 (two) times daily as needed (for eczema).     ? valACYclovir (VALTREX) 500 MG tablet Take 500 mg by mouth daily as needed (for cold sores).     ? ?No current facility-administered medications for this visit.  ? ? ?Allergies  ?Allergen Reactions  ? Doxycycline Nausea And Vomiting  ? Latex Rash  ? ? ?No family history on file. ? ?Social History  ? ?Socioeconomic History  ? Marital status: Single  ?  Spouse name: Not on file  ? Number of children: Not on file  ? Years of education: Not on file  ? Highest education level: Not on file  ?Occupational History  ? Not on file  ?Tobacco Use  ? Smoking status: Never  ? Smokeless tobacco: Never  ?Vaping Use  ? Vaping Use: Never used  ?Substance and Sexual Activity  ? Alcohol use: No  ? Drug use: Never  ? Sexual activity: Yes  ?  Birth control/protection: None  ?Other Topics Concern  ? Not on file  ?Social History Narrative  ? Not on file  ? ?Social  Determinants of Health  ? ?Financial Resource Strain: Not on file  ?Food Insecurity: Not on file  ?Transportation Needs: Not on file  ?Physical Activity: Not on file  ?Stress: Not on file  ?Social Connections: Not on file  ?Intimate Partner Violence: Not on file  ? ? ?ROS: ? ?Constitutional: Denies fever, malaise, fatigue, headache or abrupt weight changes.  ?HEENT: Denies eye pain, eye redness, ear pain, ringing in the ears, wax buildup, runny nose, nasal congestion, bloody nose, or sore throat. ?Respiratory: Denies difficulty breathing, shortness of breath, cough or sputum production.   ?Cardiovascular: Denies chest pain, chest tightness, palpitations or swelling in the hands or feet.  ?Gastrointestinal: Denies abdominal pain, bloating, constipation, diarrhea or blood in the stool.  ?GU: Denies frequency, urgency, pain with  urination, blood in urine, odor or discharge. ?Musculoskeletal: Denies decrease in range of motion, difficulty with gait, muscle pain or joint pain and swelling.  ?Skin: Denies redness, rashes, lesions or ulcercations.  ?Neurological: Denies dizziness, difficulty with memory, difficulty with speech or problems with balance and coordination.  ?Psych: Patient has history of anxiety.  Denies depression, SI/HI. ? ?No other specific complaints in a complete review of systems (except as listed in HPI above). ? ?PE: ? ?There were no vitals taken for this visit. ?Wt Readings from Last 3 Encounters:  ?08/22/21 180 lb (81.6 kg)  ?01/01/21 166 lb (75.3 kg)  ?06/29/20 166 lb (75.3 kg) (90 %, Z= 1.28)*  ? ?* Growth percentiles are based on CDC (Girls, 2-20 Years) data.  ? ? ?General: Appears their stated age, well developed, well nourished in NAD. ?HEENT: Head: normal shape and size; Eyes: sclera white, no icterus, conjunctiva pink, PERRLA and EOMs intact; Ears: Tm's gray and intact, normal light reflex;Throat/Mouth: Teeth present, mucosa pink and moist, no lesions or ulcerations noted.  ?Neck: Neck supple, trachea midline. No masses, lumps or thyromegaly present.  ?Cardiovascular: Normal rate and rhythm. S1,S2 noted.  No murmur, rubs or gallops noted. No JVD or BLE edema. No carotid bruits noted. ?Pulmonary/Chest: Normal effort and positive vesicular breath sounds. No respiratory distress. No wheezes, rales or ronchi noted.  ?Abdomen: Soft and nontender. Normal bowel sounds, no bruits noted. No distention or masses noted. Liver, spleen and kidneys non palpable. ?Musculoskeletal: Normal range of motion. Strength 5/5 BUE/BLE. No signs of joint swelling. No difficulty with gait.  ?Neurological: Alert and oriented. Cranial nerves II-XII grossly intact. Coordination normal.  ?Psychiatric: Mood and affect normal. Behavior is normal. Judgment and thought content normal.  ? ?BMET ?   ?Component Value Date/Time  ? NA 139 08/22/2021 0521   ? NA 140 08/19/2014 0954  ? K 4.7 08/22/2021 0521  ? K 3.3 08/19/2014 0954  ? CL 106 08/22/2021 0521  ? CL 105 08/19/2014 0954  ? CO2 28 08/22/2021 0521  ? CO2 27 (H) 08/19/2014 0954  ? GLUCOSE 108 (H) 08/22/2021 0521  ? GLUCOSE 83 08/19/2014 0954  ? BUN 7 08/22/2021 0521  ? BUN 8 (L) 08/19/2014 0954  ? CREATININE 0.65 08/22/2021 0521  ? CREATININE 0.76 08/19/2014 0954  ? CALCIUM 8.6 (L) 08/22/2021 0521  ? CALCIUM 8.7 (L) 08/19/2014 0954  ? GFRNONAA >60 08/22/2021 0521  ? GFRAA >60 05/09/2020 1741  ? ? ?Lipid Panel  ?No results found for: CHOL, TRIG, HDL, CHOLHDL, VLDL, LDLCALC ? ?CBC ?   ?Component Value Date/Time  ? WBC 8.6 08/22/2021 0521  ? RBC 4.47 08/22/2021 0521  ? HGB 14.0 08/22/2021 0521  ? HGB 13.5 08/19/2014  [id number]  ? HCT 40.8 08/22/2021 0521  ? HCT 40.6 08/19/2014 0954  ? PLT 265 08/22/2021 0521  ? PLT 270 08/19/2014 0954  ? MCV 91.3 08/22/2021 0521  ? MCV 91 08/19/2014 0954  ? MCH 31.3 08/22/2021 0521  ? MCHC 34.3 08/22/2021 0521  ? RDW 12.0 08/22/2021 0521  ? RDW 13.2 08/19/2014 0954  ? LYMPHSABS 3.0 08/22/2021 0521  ? MONOABS 0.5 08/22/2021 0521  ? EOSABS 0.2 08/22/2021 0521  ? BASOSABS 0.1 08/22/2021 0521  ? ? ?Hgb A1C ?No results found for: HGBA1C ? ? ?Assessment and Plan: ? ? ?Webb Silversmith, NP ? ?

## 2022-01-07 IMAGING — US US PELVIS COMPLETE WITH TRANSVAGINAL
1 series · 14 of 25 positions shown · non-contrast
Comparison: 09/20/2019

CLINICAL DATA: Lower abdominal pain for 1 week

EXAM:
TRANSABDOMINAL AND TRANSVAGINAL ULTRASOUND OF PELVIS
TECHNIQUE: Both transabdominal and transvaginal ultrasound examinations of the
pelvis were performed. Transabdominal technique was performed for
global imaging of the pelvis including uterus, ovaries, adnexal
regions, and pelvic cul-de-sac. It was necessary to proceed with
endovaginal exam following the transabdominal exam to visualize the
endometrium and adnexal structures.

[Series 1: us pelvic complete with transvaginal · 14 of 103 slices shown]
[im 1/103]
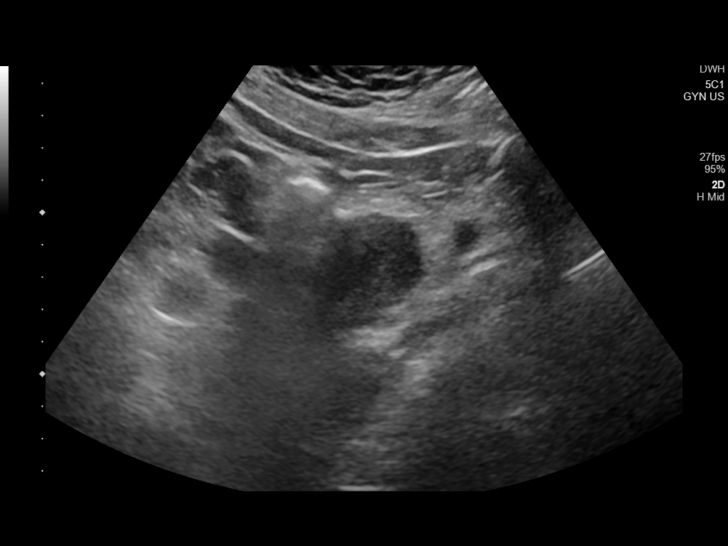
[im 9/103]
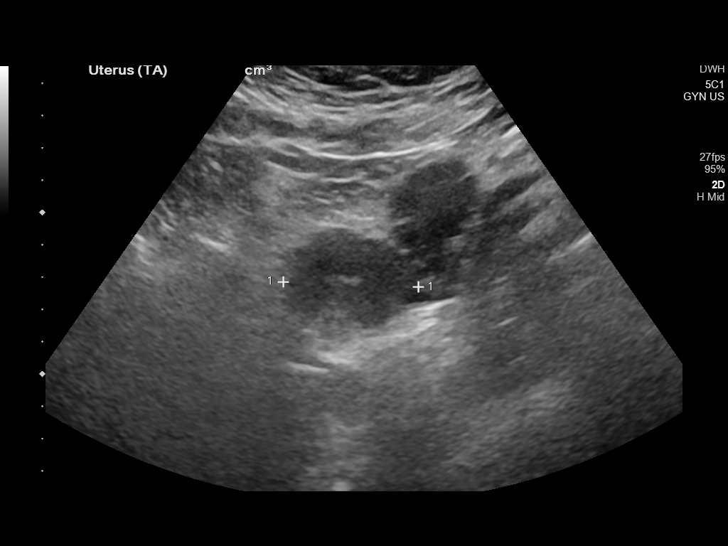
[im 18/103]
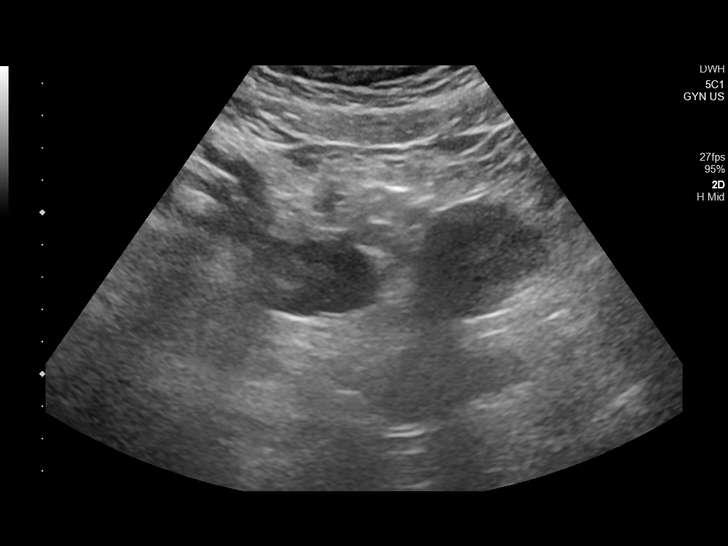
[im 26/103]
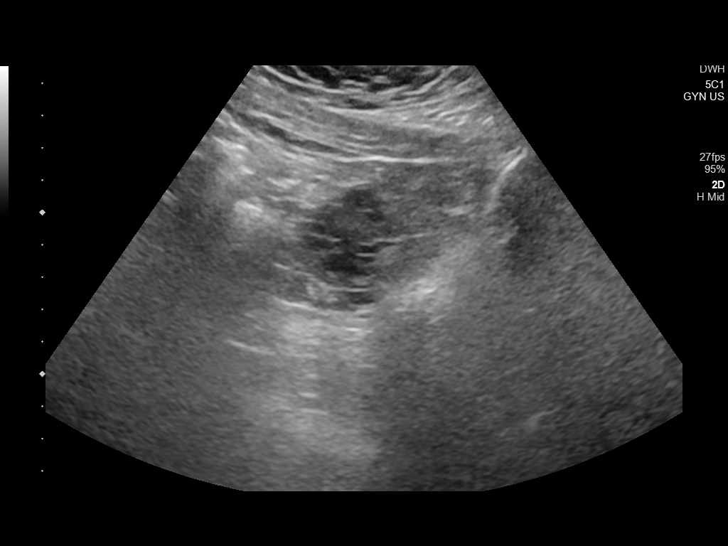
[im 35/103]
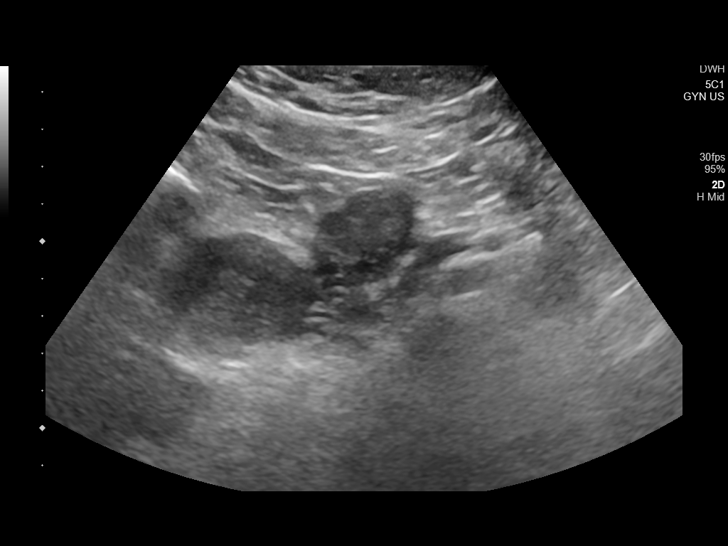
[im 39/103]
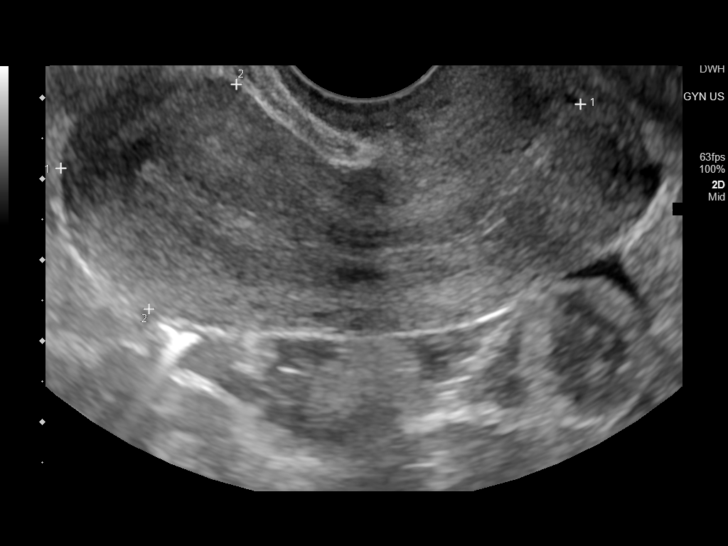
[im 47/103]
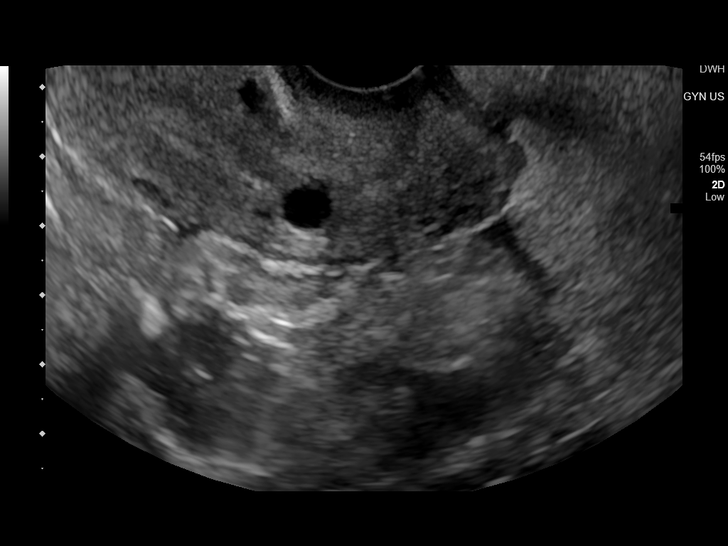
[im 56/103]
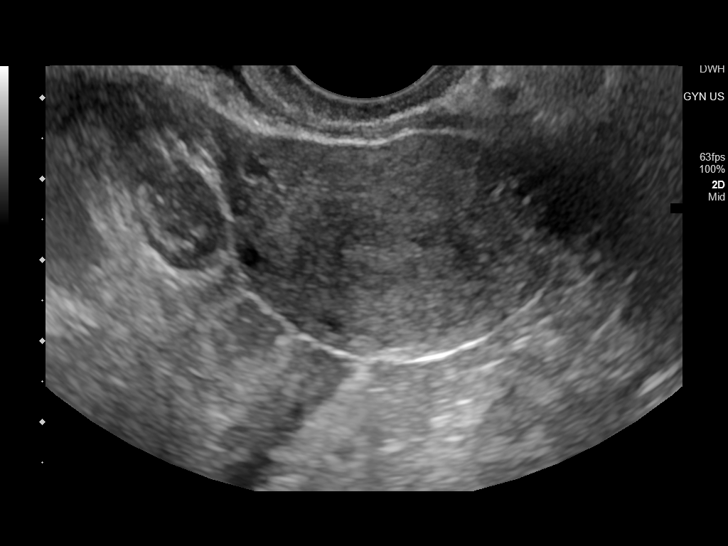
[im 64/103]
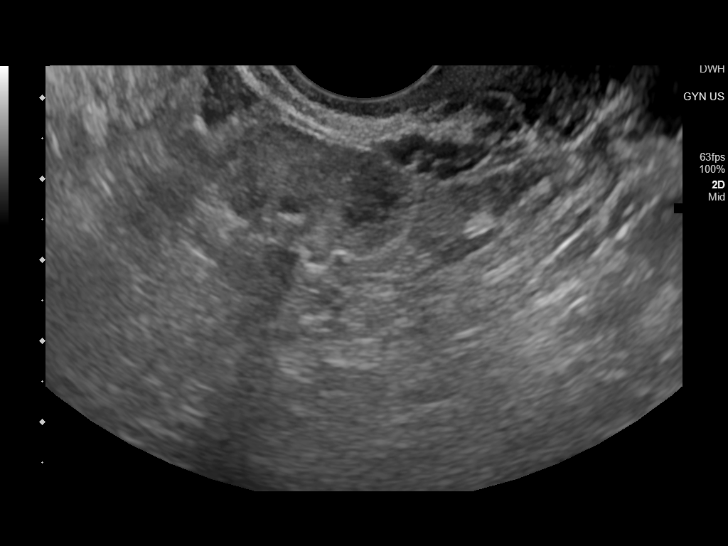
[im 69/103]
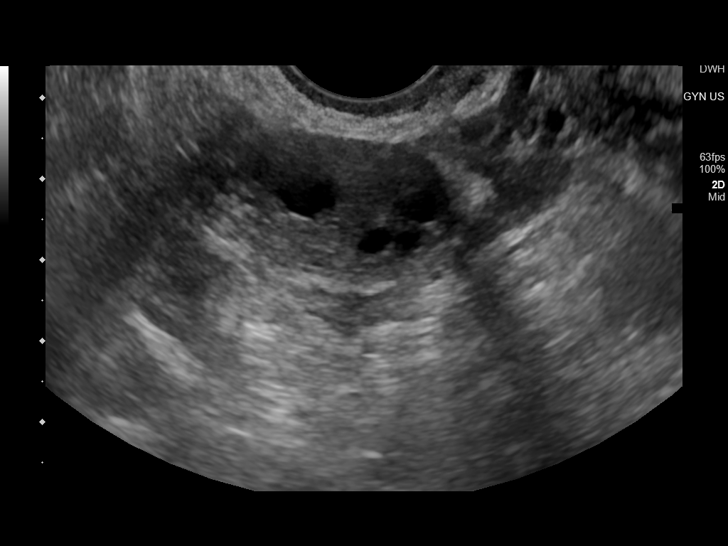
[im 77/103]
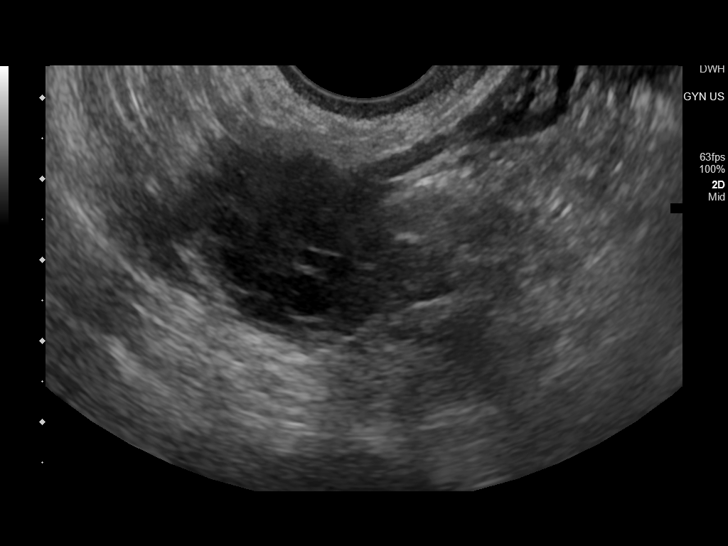
[im 86/103]
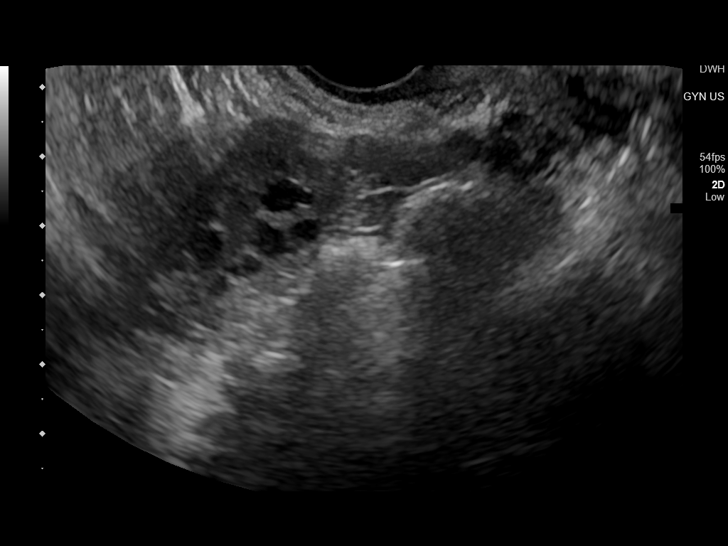
[im 94/103]
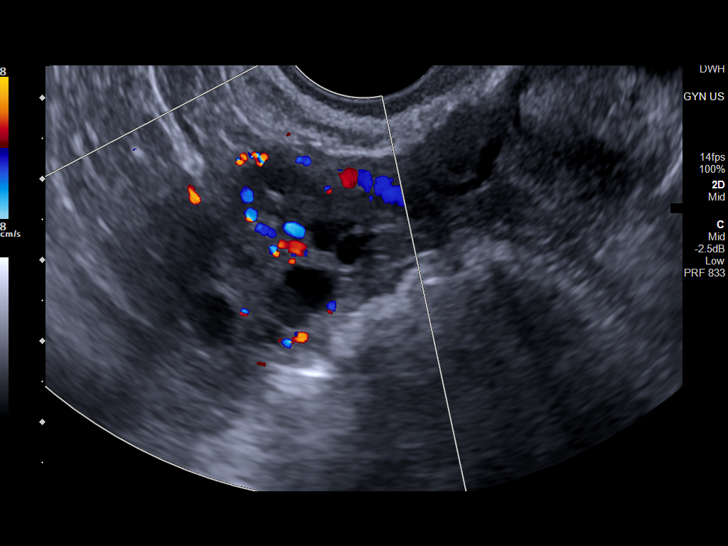
[im 103/103]
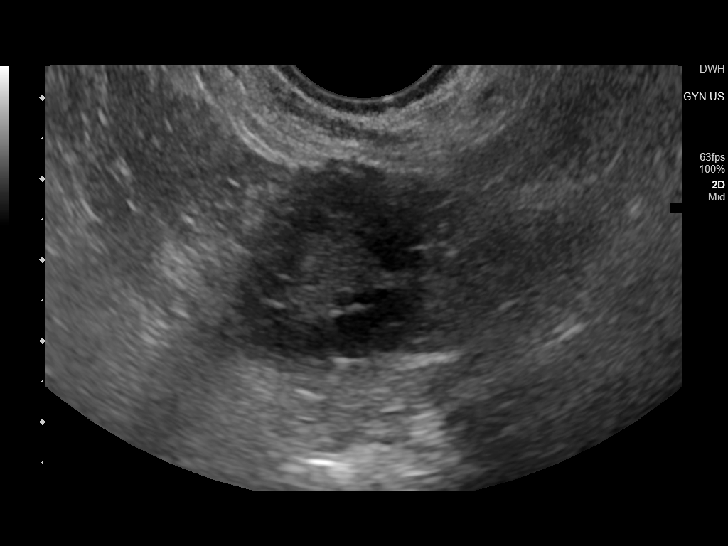

[14 of 25 positions shown; findings below may reference images not displayed]

FINDINGS: Uterus

Measurements: 6.5 x 3.0 by 4.2 cm = volume: 42 mL. No fibroids or
other mass visualized.

Endometrium

Thickness: 5 mm.  No focal abnormality visualized.

Right ovary

Measurements: 3.6 x 2.7 x 3.3 cm = volume: 16.3 mL. Multiple normal
appearing follicles. No other abnormalities.

Left ovary

Measurements: 3.9 x 2.3 x 2.2 cm = volume: 10.6 mL. Multiple normal
appearing follicles. No other abnormalities.

Other findings

No abnormal free fluid.
IMPRESSION: 1. Numerous follicles within bilateral ovaries. Otherwise
unremarkable exam.

## 2022-02-18 IMAGING — US US PELVIS COMPLETE TRANSABD/TRANSVAG W DUPLEX
1 series · 13 of 25 positions shown · non-contrast
Comparison: Prior ultrasound from 03/29/2020

CLINICAL DATA: Initial evaluation for acute bilateral pelvic pain
and cramping. History of PCOS.

EXAM:
TRANSABDOMINAL AND TRANSVAGINAL ULTRASOUND OF PELVIS
DOPPLER ULTRASOUND OF OVARIES
TECHNIQUE: Both transabdominal and transvaginal ultrasound examinations of the
pelvis were performed. Transabdominal technique was performed for
global imaging of the pelvis including uterus, ovaries, adnexal
regions, and pelvic cul-de-sac.
It was necessary to proceed with endovaginal exam following the
transabdominal exam to visualize the uterus, endometrium, and
ovaries. Color and duplex Doppler ultrasound was utilized to
evaluate blood flow to the ovaries.

[Series 1: us pelvic complete w transvaginal and torsion righ · 13 of 103 slices shown]
[im 1/103]
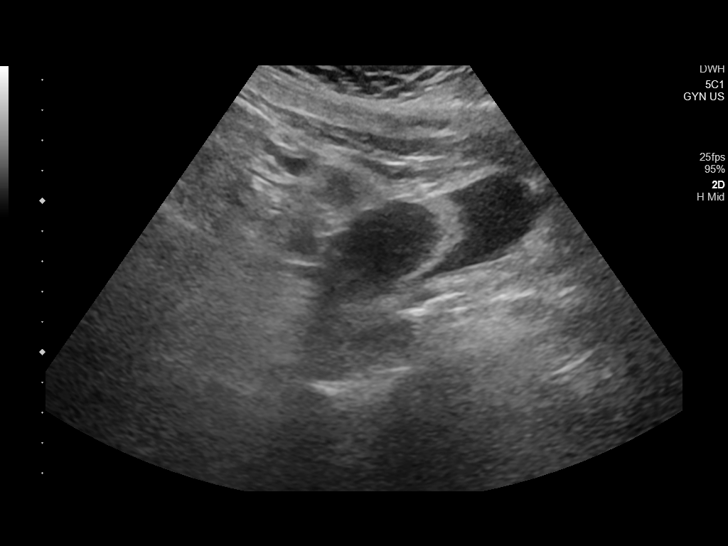
[im 9/103]
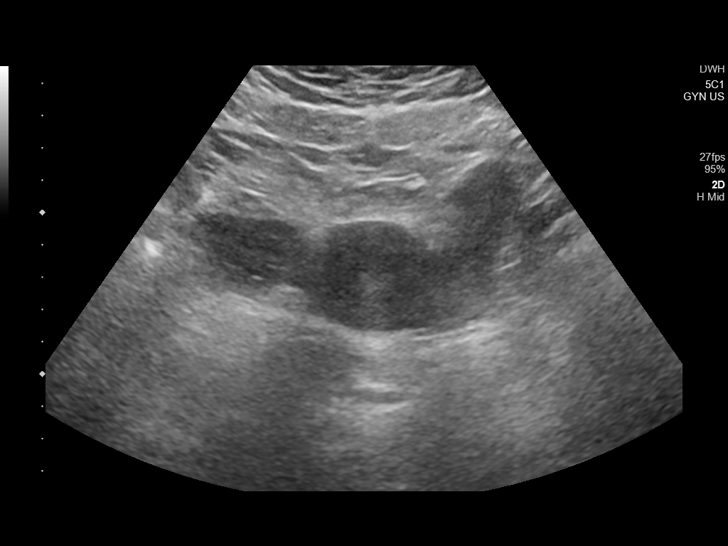
[im 18/103]
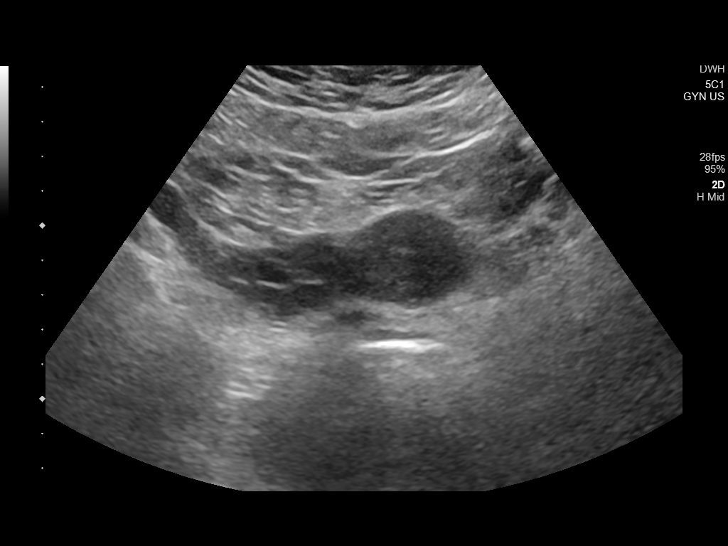
[im 26/103]
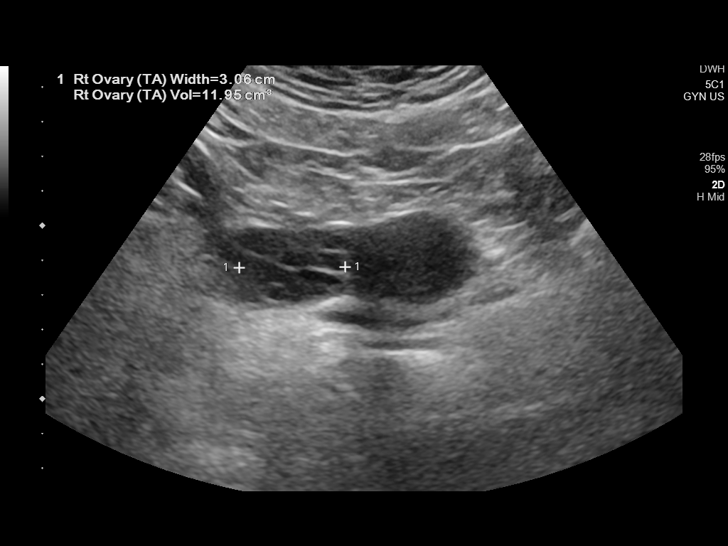
[im 35/103]
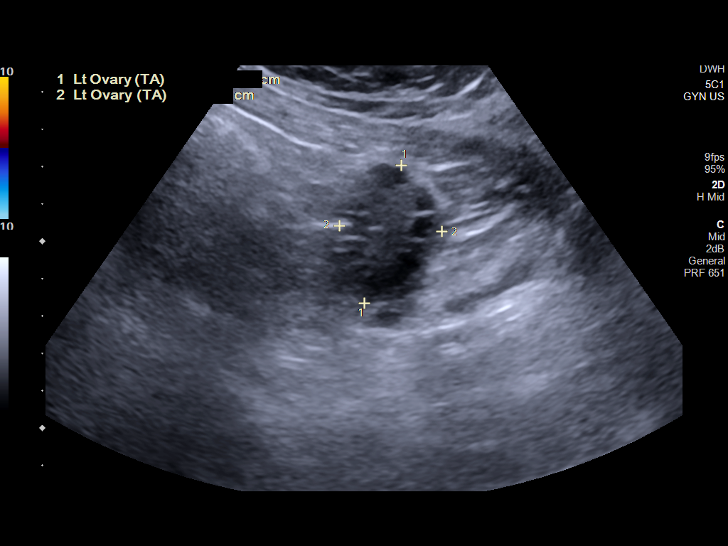
[im 43/103]
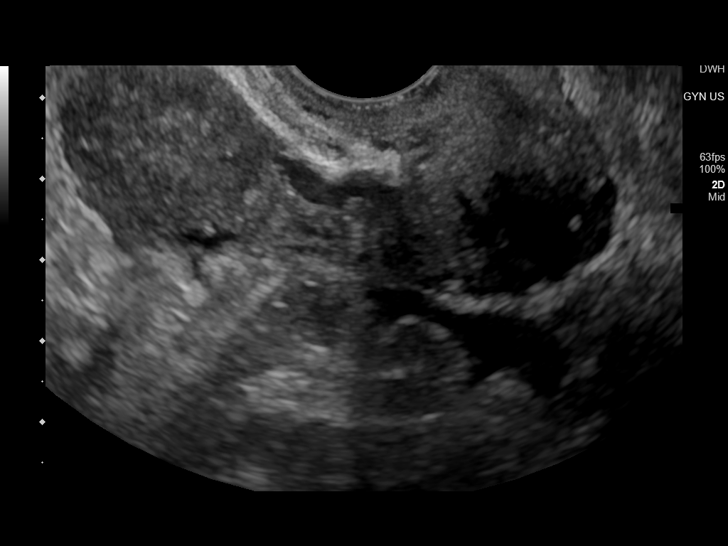
[im 52/103]
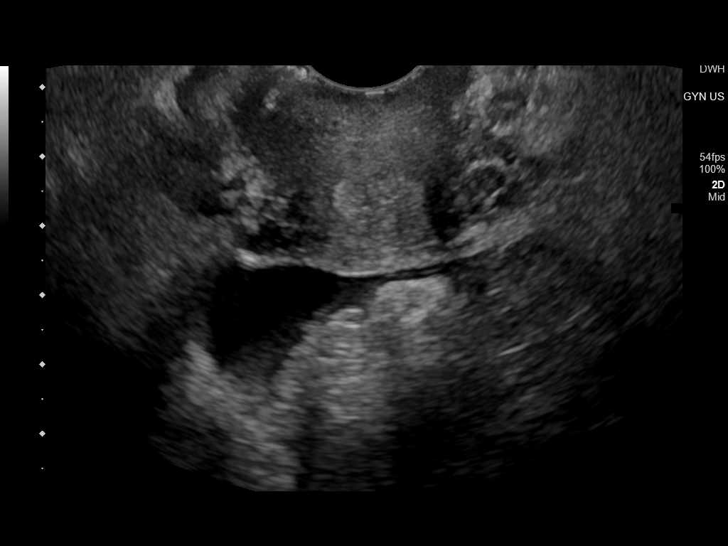
[im 60/103]
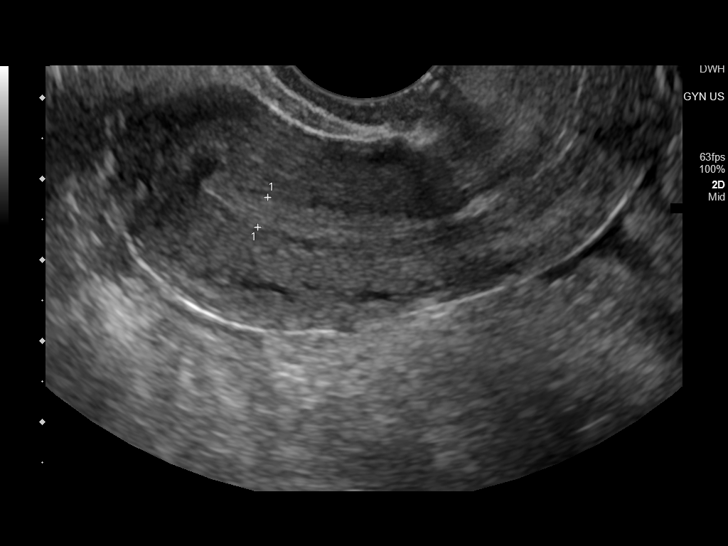
[im 69/103]
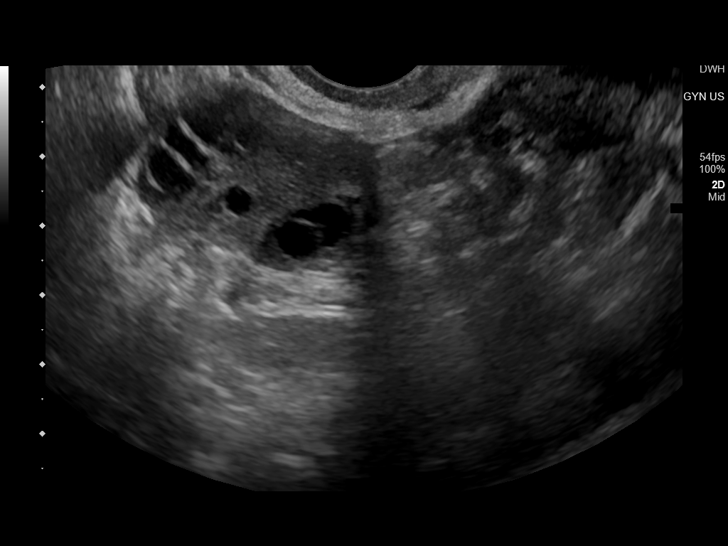
[im 77/103]
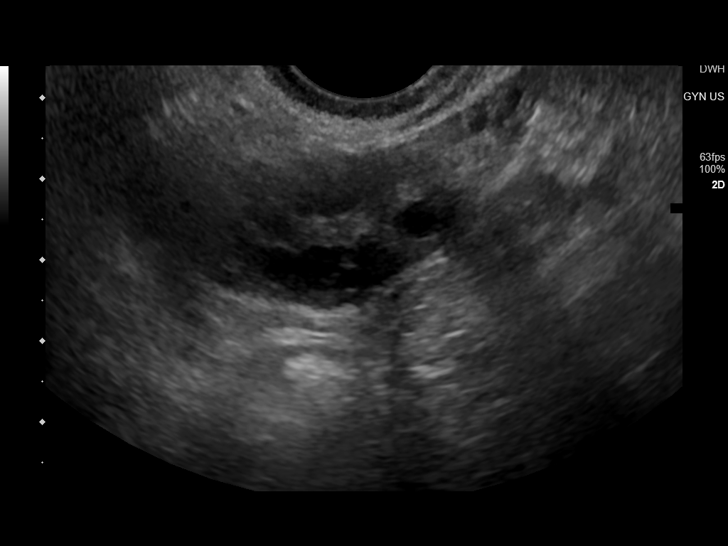
[im 86/103]
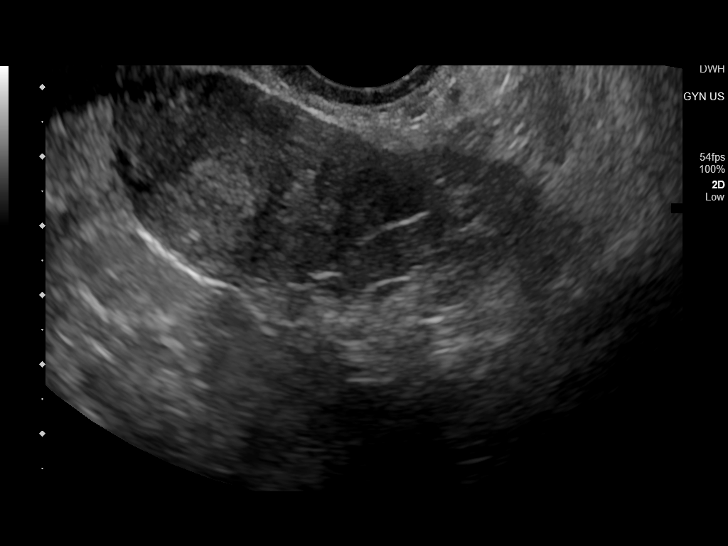
[im 94/103]
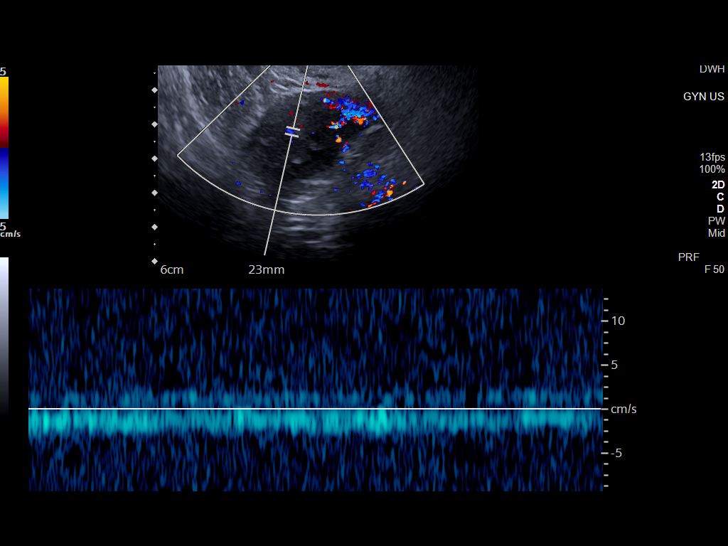
[im 103/103]
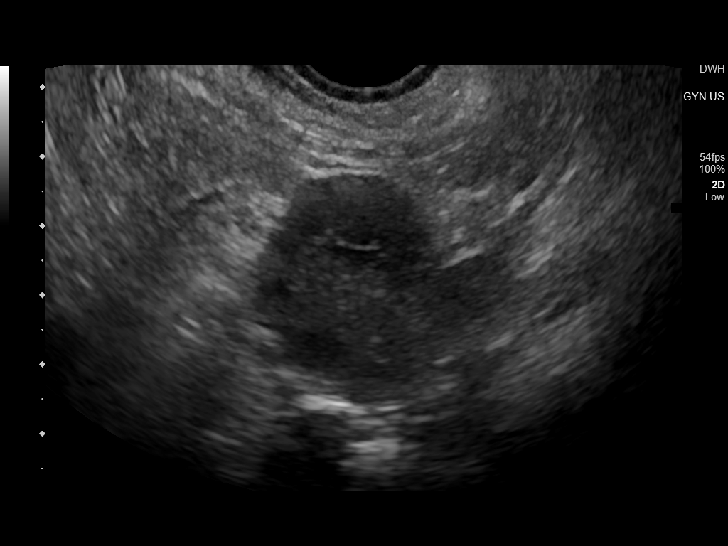

[13 of 25 positions shown; findings below may reference images not displayed]

FINDINGS: Uterus

Measurements: 6.2 x 3.1 x 4.0 cm = volume: 40.4 mL. No fibroids or
other mass visualized.

Endometrium

Thickness: 3.9 mm.  No focal abnormality visualized.

Right ovary

Measurements: 3.5 x 2.3 x 4.4 cm = volume: 18.6 mL. Prominent
ovarian size with multiple peripherally oriented follicles. No
adnexal mass.

Left ovary

Measurements: 3.7 x 3.0 x 2.7 cm = volume: 15.7 mL. Prominent
ovarian size with multiple peripherally oriented follicles. No
adnexal mass.

Pulsed Doppler evaluation of both ovaries demonstrates normal
low-resistance arterial and venous waveforms.

Other findings

Small volume free fluid within the pelvis, presumably physiologic.
IMPRESSION: 1. Prominent ovarian size with multiple peripherally oriented
follicles, consistent with history of PCOS.
2. Small volume free fluid within the pelvis, presumably
physiologic.
3. Otherwise unremarkable and normal pelvic ultrasound. No evidence
for ovarian torsion or other acute abnormality.

## 2022-05-23 ENCOUNTER — Emergency Department
Admission: EM | Admit: 2022-05-23 | Discharge: 2022-05-23 | Disposition: A | Discharge status: Home / Self Care | Payer: Medicaid Other | Attending: Student in an Organized Health Care Education/Training Program | Admitting: Student in an Organized Health Care Education/Training Program

## 2022-05-23 ENCOUNTER — Other Ambulatory Visit: Payer: Self-pay

## 2022-05-23 ENCOUNTER — Encounter: Payer: Self-pay | Admitting: Emergency Medicine

## 2022-05-23 ENCOUNTER — Emergency Department: Payer: Medicaid Other

## 2022-05-23 DIAGNOSIS — L03115 Cellulitis of right lower limb: Secondary | ICD-10-CM | POA: Diagnosis not present

## 2022-05-23 DIAGNOSIS — M25571 Pain in right ankle and joints of right foot: Secondary | ICD-10-CM

## 2022-05-23 LAB — COMPREHENSIVE METABOLIC PANEL
ALT: 20 U/L (ref 0–44)
AST: 20 U/L (ref 15–41)
Albumin: 4.3 g/dL (ref 3.5–5.0)
Alkaline Phosphatase: 63 U/L (ref 38–126)
Anion gap: 6 (ref 5–15)
BUN: 5 mg/dL — ABNORMAL LOW (ref 6–20)
CO2: 24 mmol/L (ref 22–32)
Calcium: 8.7 mg/dL — ABNORMAL LOW (ref 8.9–10.3)
Chloride: 110 mmol/L (ref 98–111)
Creatinine, Ser: 0.71 mg/dL (ref 0.44–1.00)
GFR, Estimated: >60 mL/min (ref >60)
Glucose, Bld: 91 mg/dL (ref 70–99)
Potassium: 3.3 mmol/L — ABNORMAL LOW (ref 3.5–5.1)
Sodium: 140 mmol/L (ref 135–145)
Total Bilirubin: 0.9 mg/dL (ref 0.3–1.2)
Total Protein: 7 g/dL (ref 6.5–8.1)

## 2022-05-23 LAB — CBC WITH DIFFERENTIAL/PLATELET
Abs Immature Granulocytes: 0.02 10*3/uL (ref 0.00–0.07)
Basophils Absolute: 0.1 10*3/uL (ref 0.0–0.1)
Basophils Relative: 1 %
Eosinophils Absolute: 0.2 10*3/uL (ref 0.0–0.5)
Eosinophils Relative: 2 %
HCT: 39 % (ref 36.0–46.0)
Hemoglobin: 13.7 g/dL (ref 12.0–15.0)
Immature Granulocytes: 0 %
Lymphocytes Relative: 18 %
Lymphs Abs: 1.7 10*3/uL (ref 0.7–4.0)
MCH: 32.5 pg (ref 26.0–34.0)
MCHC: 35.1 g/dL (ref 30.0–36.0)
MCV: 92.6 fL (ref 80.0–100.0)
Monocytes Absolute: 0.6 10*3/uL (ref 0.1–1.0)
Monocytes Relative: 7 %
Neutro Abs: 6.7 10*3/uL (ref 1.7–7.7)
Neutrophils Relative %: 72 %
Platelets: 239 10*3/uL (ref 150–400)
RBC: 4.21 MIL/uL (ref 3.87–5.11)
RDW: 11.9 % (ref 11.5–15.5)
WBC: 9.2 10*3/uL (ref 4.0–10.5)
nRBC: 0 % (ref 0.0–0.2)

## 2022-05-23 MED ORDER — CEPHALEXIN 500 MG PO CAPS: 500.0000 mg | ORAL_CAPSULE | Freq: Three times a day (TID) | ORAL | 0 refills | Status: AC

## 2022-05-23 MED ORDER — DIPHENHYDRAMINE HCL 25 MG PO CAPS
25.0000 mg | ORAL_CAPSULE | Freq: Once | ORAL | Status: AC
  Administered 2022-05-23: 25 mg via ORAL
  Filled 2022-05-23: qty 1

## 2022-05-23 MED ORDER — CEPHALEXIN 500 MG PO CAPS
500.0000 mg | ORAL_CAPSULE | Freq: Once | ORAL | Status: AC
  Administered 2022-05-23: 500 mg via ORAL
  Filled 2022-05-23: qty 1

## 2022-05-23 NOTE — ED Provider Notes (Signed)
[facility] Provider Note    Event Date/Time   First MD Initiated Contact with Patient 05/23/22 [phone]     (approximate)   History   Ankle Pain   HPI  Tiffany Bush is a 21 y.o. female no significant past medical history presents to the ER for evaluation of right ankle pain redness in the medial aspect of her ankle after she was mowing her lawn not wearing socks.  Denies any other associated injury.  No calf swelling.  Is primarily very itchy burning discomfort.  Is never had symptoms like this before.  No history of diabetes.  No fevers or chills.     Physical Exam   Triage Vital Signs: ED Triage Vitals  Enc Vitals Group     BP 05/23/22 0104 (!) 115/58     Pulse Rate 05/23/22 0104 78     Resp 05/23/22 0104 18     Temp 05/23/22 0104 98.6 F (37 C)     Temp Source 05/23/22 0104 Oral     SpO2 05/23/22 0104 91 %     Weight 05/23/22 0059 171 lb (77.6 kg)     Height 05/23/22 0059 5\' 2"  (1.575 m)     Head Circumference --      Peak Flow --      Pain Score 05/23/22 0059 10     Pain Loc --      Pain Edu? --      Excl. in [place]? --     Most recent vital signs: Vitals:   05/23/22 0104  BP: (!) 115/58  Pulse: 78  Resp: 18  Temp: 98.6 F (37 C)  SpO2: 91%     Constitutional: Alert  Eyes: Conjunctivae are normal.  Head: Atraumatic. Nose: No congestion/rhinnorhea. Mouth/Throat: Mucous membranes are moist.   Neck: Painless ROM.  Cardiovascular:   Good peripheral circulation. Respiratory: Normal respiratory effort.  No retractions.  Gastrointestinal: Soft and nontender.  Musculoskeletal:  no deformity Neurologic:  MAE spontaneously. No gross focal neurologic deficits are appreciated.  Skin:  Skin is warm, dry.  Medial aspect of the right ankle there is patch of cellulitic changes erythema warmth.  No blistering or crepitus.  Has 2 purulent avulsed blisters over the Achilles tendon. Psychiatric: Mood and affect are normal. Speech and behavior are  normal.    ED Results / Procedures / Treatments   Labs (all labs ordered are listed, but only abnormal results are displayed) Labs Reviewed  COMPREHENSIVE METABOLIC PANEL - Abnormal; Notable for the following components:      Result Value   Potassium 3.3 (*)    BUN 5 (*)    Calcium 8.7 (*)    All other components within normal limits  CBC WITH DIFFERENTIAL/PLATELET     EKG     RADIOLOGY Please see ED Course for my review and interpretation.  I personally reviewed all radiographic images ordered to evaluate for the above acute complaints and reviewed radiology reports and findings.  These findings were personally discussed with the patient.  Please see medical record for radiology report.     PROCEDURES:  Critical Care performed: No  Procedures   MEDICATIONS ORDERED IN ED: Medications  cephALEXin (KEFLEX) capsule 500 mg (has no administration in time range)  diphenhydrAMINE (BENADRYL) capsule 25 mg (has no administration in time range)     IMPRESSION / MDM / ASSESSMENT AND PLAN / ED COURSE  I reviewed the triage vital signs and the nursing notes.  Differential diagnosis includes, but is not limited to, cellulitis, abscess, dermatitis, foreign body  Patient presented to the ER for evaluation of symptoms as described above.  Clinically appears consistent with mild cellulitis.  No abscess.  No foreign body palpated.  No specific trauma to indicate fracture or dislocation.  Not consistent with DVT.  Clinical Course as of 05/23/22 0414  Wed May 23, 2022  0356 X-ray ankle my review interpretation does not show any evidence of fracture or dislocation. [PR]    Clinical Course User Index [PR] Willy Eddy, MD   Cover with antibiotics for cellulitis.  Discussed strict return precautions.   FINAL CLINICAL IMPRESSION(S) / ED DIAGNOSES   Final diagnoses:  Acute right ankle pain  Cellulitis of right lower extremity     Rx / DC  Orders   ED Discharge Orders          Ordered    cephALEXin (KEFLEX) 500 MG capsule  3 times daily        05/23/22 0408             Note:  This document was prepared using Dragon voice recognition software and may include unintentional dictation errors.    Willy Eddy, MD 05/23/22 [id number]

## 2022-05-23 NOTE — ED Triage Notes (Signed)
Patient ambulatory to triage with steady gait, without difficulty or distress noted; pt reports after mowing last Saturday noted two areas of redness to her rt ankle; st redness/swelling has increased since with "itching"

## 2022-10-31 ENCOUNTER — Encounter: Payer: Self-pay | Admitting: Emergency Medicine

## 2022-10-31 ENCOUNTER — Emergency Department
Admission: EM | Admit: 2022-10-31 | Discharge: 2022-10-31 | Disposition: A | Discharge status: Home / Self Care | Payer: Medicaid Other | Attending: Emergency Medicine | Admitting: Emergency Medicine

## 2022-10-31 DIAGNOSIS — H61002 Unspecified perichondritis of left external ear: Secondary | ICD-10-CM | POA: Diagnosis not present

## 2022-10-31 DIAGNOSIS — M948X9 Other specified disorders of cartilage, unspecified sites: Secondary | ICD-10-CM

## 2022-10-31 DIAGNOSIS — H9202 Otalgia, left ear: Secondary | ICD-10-CM | POA: Diagnosis present

## 2022-10-31 MED ORDER — CIPROFLOXACIN HCL 500 MG PO TABS
750.0000 mg | ORAL_TABLET | Freq: Once | ORAL | Status: AC
  Administered 2022-10-31: 750 mg via ORAL
  Filled 2022-10-31: qty 2

## 2022-10-31 MED ORDER — CIPROFLOXACIN HCL 500 MG PO TABS: 750.0000 mg | ORAL_TABLET | Freq: Two times a day (BID) | ORAL | 0 refills | Status: AC

## 2022-10-31 NOTE — ED Provider Notes (Signed)
[facility] Provider Note  Patient Contact: 10:23 PM (approximate)   History   Otalgia   HPI  Tiffany Bush is a 22 y.o. female presents to the emergency department with prominent lymph nodes behind her left ear and along the left lateral aspect of her neck after patient tried to pierce her ear and multiple places.  Patient reports that she did not sterilize the skin prior to piercing herself.  She has erythema and some purulent drainage from cartilage.  No prior history of perichondritis in the past.      Physical Exam   Triage Vital Signs: ED Triage Vitals  Enc Vitals Group     BP 10/31/22 1947 120/71     Pulse Rate 10/31/22 1947 62     Resp 10/31/22 1947 18     Temp 10/31/22 1947 98.9 F (37.2 C)     Temp Source 10/31/22 1947 Oral     SpO2 10/31/22 1947 98 %     Weight 10/31/22 1948 160 lb (72.6 kg)     Height 10/31/22 1948 5' 3"$  (1.6 m)     Head Circumference --      Peak Flow --      Pain Score 10/31/22 1947 4     Pain Loc --      Pain Edu? --      Excl. in [place]? --     Most recent vital signs: Vitals:   10/31/22 1947  BP: 120/71  Pulse: 62  Resp: 18  Temp: 98.9 F (37.2 C)  SpO2: 98%     General: Alert and in no acute distress. Eyes:  PERRL. EOMI. Head: No acute traumatic findings ENT:      Ears: Patient has multiple puncture wounds along the cartilage of the left ear.  Perichondritis visualized of the left cartilage.  She has prominent pre and postauricular lymph nodes palpated.      Nose: No congestion/rhinnorhea.      Mouth/Throat: Mucous membranes are moist. Neck: No stridor. No cervical spine tenderness to palpation. Hematological/Lymphatic/Immunilogical: No cervical lymphadenopathy. Cardiovascular:  Good peripheral perfusion Respiratory: Normal respiratory effort without tachypnea or retractions. Lungs CTAB. Good air entry to the bases with no decreased or absent breath sounds. Gastrointestinal: Bowel sounds 4  quadrants. Soft and nontender to palpation. No guarding or rigidity. No palpable masses. No distention. No CVA tenderness. Musculoskeletal: Full range of motion to all extremities.  Neurologic:  No gross focal neurologic deficits are appreciated.  Skin:   No rash noted    ED Results / Procedures / Treatments   Labs (all labs ordered are listed, but only abnormal results are displayed) Labs Reviewed - No data to display     PROCEDURES:  Critical Care performed: No  Procedures   MEDICATIONS ORDERED IN ED: Medications  ciprofloxacin (CIPRO) tablet 750 mg (750 mg Oral Given 10/31/22 2208)     IMPRESSION / MDM / [place] / ED COURSE  I reviewed the triage vital signs and the nursing notes.                              Assessment and plan Perichondritis 22 year old female presents to the emergency department with perichondritis of the left cartilage after patient attempted to pierce her ear without sterilizing the skin.  Patient had some prominent pre and postauricular lymph nodes.  Will cover patient with ciprofloxacin for perichondritis twice daily for the next  7 days.  Return precautions were given to return with new or worsening symptoms.      FINAL CLINICAL IMPRESSION(S) / ED DIAGNOSES   Final diagnoses:  Perichondritis     Rx / DC Orders   ED Discharge Orders          Ordered    ciprofloxacin (CIPRO) 500 MG tablet  2 times daily        10/31/22 2205             Note:  This document was prepared using Dragon voice recognition software and may include unintentional dictation errors.   Vallarie Mare Lazear, PA-C 10/31/22 2226    Duffy Bruce, MD 11/01/22 Quentin Mulling

## 2022-10-31 NOTE — Discharge Instructions (Signed)
Take Ciprofloxacin twice daily for seven days.

## 2022-10-31 NOTE — ED Triage Notes (Signed)
Pt presents via POV with complaints of Left ear pain following a piercing with a gun. The patient states she used a gun she purchased online and pierced her ear on Saturday and she woke up with swelling and drainage around her ear. Denies fevers, CP or SOB.

## 2022-10-31 NOTE — ED Notes (Signed)
Left outer upper ear visibly red and swollen.  Knot behind ear visible. Pt states left ear is sore and tender.

## 2022-11-14 ENCOUNTER — Emergency Department: Payer: Medicaid Other

## 2022-11-14 ENCOUNTER — Encounter: Payer: Self-pay | Admitting: Emergency Medicine

## 2022-11-14 DIAGNOSIS — R059 Cough, unspecified: Secondary | ICD-10-CM | POA: Diagnosis present

## 2022-11-14 DIAGNOSIS — J339 Nasal polyp, unspecified: Secondary | ICD-10-CM | POA: Diagnosis not present

## 2022-11-14 DIAGNOSIS — Z1152 Encounter for screening for COVID-19: Secondary | ICD-10-CM | POA: Diagnosis not present

## 2022-11-14 DIAGNOSIS — Z91040 Latex allergy status: Secondary | ICD-10-CM | POA: Diagnosis not present

## 2022-11-14 DIAGNOSIS — J40 Bronchitis, not specified as acute or chronic: Secondary | ICD-10-CM | POA: Insufficient documentation

## 2022-11-14 NOTE — ED Triage Notes (Signed)
Pt presents ambulatory to triage via POV with complaints of productive cough for the last week. No meds taken PTA. Respirations equal and unlabored. A&Ox4 at this time. Denies CP or SOB.

## 2022-11-15 ENCOUNTER — Other Ambulatory Visit: Payer: Self-pay

## 2022-11-15 ENCOUNTER — Emergency Department
Admission: EM | Admit: 2022-11-15 | Discharge: 2022-11-15 | Disposition: A | Discharge status: Home / Self Care | Payer: Medicaid Other | Attending: Emergency Medicine | Admitting: Emergency Medicine

## 2022-11-15 DIAGNOSIS — J339 Nasal polyp, unspecified: Secondary | ICD-10-CM

## 2022-11-15 DIAGNOSIS — J40 Bronchitis, not specified as acute or chronic: Secondary | ICD-10-CM

## 2022-11-15 LAB — RESP PANEL BY RT-PCR (RSV, FLU A&B, COVID)  RVPGX2
Influenza A by PCR: NEGATIVE
Influenza B by PCR: NEGATIVE
Resp Syncytial Virus by PCR: NEGATIVE
SARS Coronavirus 2 by RT PCR: NEGATIVE

## 2022-11-15 MED ORDER — ALBUTEROL SULFATE HFA 108 (90 BASE) MCG/ACT IN AERS
2.0000 | INHALATION_SPRAY | Freq: Once | RESPIRATORY_TRACT | Status: AC
  Administered 2022-11-15: 2 via RESPIRATORY_TRACT
  Filled 2022-11-15: qty 6.7

## 2022-11-15 MED ORDER — HYDROCOD POLI-CHLORPHE POLI ER 10-8 MG/5ML PO SUER
5.0000 mL | Freq: Once | ORAL | Status: AC
  Administered 2022-11-15: 5 mL via ORAL
  Filled 2022-11-15: qty 5

## 2022-11-15 MED ORDER — AZITHROMYCIN 250 MG PO TABS: 250.0000 mg | ORAL_TABLET | Freq: Every day | ORAL | 0 refills | Status: DC

## 2022-11-15 MED ORDER — PREDNISONE 20 MG PO TABS
60.0000 mg | ORAL_TABLET | Freq: Once | ORAL | Status: AC
  Administered 2022-11-15: 60 mg via ORAL
  Filled 2022-11-15: qty 3

## 2022-11-15 MED ORDER — PREDNISONE 20 MG PO TABS: ORAL_TABLET | ORAL | 0 refills | Status: DC

## 2022-11-15 MED ORDER — HYDROCOD POLI-CHLORPHE POLI ER 10-8 MG/5ML PO SUER: 5.0000 mL | Freq: Two times a day (BID) | ORAL | 0 refills | Status: DC | PRN

## 2022-11-15 MED ORDER — AZITHROMYCIN 500 MG PO TABS
500.0000 mg | ORAL_TABLET | Freq: Once | ORAL | Status: AC
  Administered 2022-11-15: 500 mg via ORAL
  Filled 2022-11-15: qty 1

## 2022-11-15 NOTE — Discharge Instructions (Signed)
1.  Take and finish steroid and antibiotic as prescribed. 2.  You may take Tussionex as needed for cough. 3.  You may use Albuterol inhaler 2 puffs every 4 hours as needed for cough/wheezing/difficulty breathing. 4.  Return to the ER for worsening symptoms, persistent vomiting, difficulty breathing or other concerns.

## 2022-11-15 NOTE — ED Provider Notes (Signed)
[facility] Provider Note    Event Date/Time   First MD Initiated Contact with Patient 11/15/22 0123     (approximate)   History   Cough   HPI  Tiffany Bush is a 22 y.o. female who presents to the ED from home with a 1 week history of productive cough, nasal congestion, thoracic back pain.  Denies fever/chills, chest pain, shortness of breath, abdominal pain, nausea, vomiting or dizziness.  States this feels like when she had bronchitis over 1 month ago.  Denies recent travel, trauma or OCP use.  Is a smoker.     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 Authorizing Provider  azithromycin (ZITHROMAX) 250 MG tablet Take 1 tablet (250 mg total) by mouth daily. 11/15/22  Yes Paulette Blanch, MD  chlorpheniramine-HYDROcodone (TUSSIONEX) 10-8 MG/5ML Take 5 mLs by mouth every 12 (twelve) hours as needed for cough. 11/15/22  Yes  Paulette Blanch, MD  predniSONE (DELTASONE) 20 MG tablet 3 tablets daily x 4 days 11/15/22  Yes Paulette Blanch, MD  albuterol (PROVENTIL HFA;VENTOLIN HFA) 108 (90 Base) MCG/ACT inhaler Inhale 2 puffs into the lungs every 6 (six) hours as needed for wheezing or shortness of breath (PER MOM, PT CURRENTLY OUT OF INHALER AND WILL NEED TO GET ANOTHER RX).  10/07/16   [provider]  brompheniramine-pseudoephedrine-DM 30-2-10 MG/5ML syrup Take 5 mLs by mouth 4 (four) times daily as needed. 12/29/19   Sable Feil, PA-C  cetirizine (ZYRTEC) 10 MG tablet Take 10 mg by mouth at bedtime.     [provider]  cyclobenzaprine (FLEXERIL) 5 MG tablet Take 1 tablet (5 mg total) by mouth 3 (three) times daily as needed. 08/22/21   Blake Divine, MD  dicyclomine (BENTYL) 20 MG tablet Take 1 tablet (20 mg total) by mouth 4 (four) times daily -  before meals and at bedtime for 5 days. 09/21/19 09/26/19  Duffy Bruce, MD  levonorgestrel-ethinyl estradiol (SEASONALE) 0.15-0.03 MG tablet Take 1 tablet by mouth daily. 12/08/19   [provider]  medroxyPROGESTERone (PROVERA) 10 MG tablet Take 1 tablet daily x 10 days to trigger period 05/25/20   [provider]  meloxicam (MOBIC) 7.5 MG tablet Take 7.5 mg by mouth daily. 06/20/20   [provider]  metFORMIN (GLUCOPHAGE) 500 MG tablet Take 500 mg by mouth 3 (three) times daily. 06/21/20   [provider]  methylPREDNISolone (MEDROL DOSEPAK) 4 MG TBPK tablet Take Tapered dose as directed 12/29/19   Sable Feil, PA-C  omeprazole (PRILOSEC) 20 MG capsule Take 1 capsule (20 mg total) by mouth daily for 10 days. 09/21/19 10/01/19  Duffy Bruce, MD  ondansetron (ZOFRAN) 8 MG tablet Take by mouth. 02/17/20   [provider]  triamcinolone ointment (KENALOG) 0.1 % Apply 1 application topically 2 (two) times daily as needed (for eczema).  10/07/16   [provider]  valACYclovir (VALTREX) 500 MG tablet Take 500 mg by  mouth daily as needed (for cold sores).  01/08/17   [provider]     Allergies  Doxycycline and Latex   Family History  History reviewed. No pertinent family history.   Physical Exam  Triage Vital Signs: ED Triage Vitals [11/14/22 2302]  Enc Vitals Group     BP 116/82     Pulse Rate 62     Resp 18     Temp 98.2 F (36.8 C)     Temp Source Oral     SpO2 100 %     Weight 148 lb (67.1 kg)     Height '5\' 2"'$  (1.575 m)     Head Circumference      Peak Flow      Pain Score 0     Pain Loc      Pain Edu?      Excl. in [place]?     Updated Vital Signs: BP 116/82 (BP Location: Left Arm)   Pulse 62   Temp 98.2 F (36.8 C) (Oral)   Resp 18   Ht '5\' 2"'$  (1.575 m)   Wt 67.1 kg   LMP  (LMP Unknown)   SpO2 100%   BMI 27.07 kg/m    General: Awake, no distress.  CV:  RRR.  Good peripheral perfusion.  Resp:  Normal effort.  CTAB. Abd:  Nontender.  No distention.  Other:  Bilateral calves are supple and nontender. + Nasal congestion.   ED Results / Procedures / Treatments  Labs (all labs ordered are listed, but only abnormal results are displayed) Labs Reviewed  RESP PANEL BY RT-PCR (RSV, FLU A&B, COVID)  [id number]     EKG  None   RADIOLOGY I have independently visualized and interpreted patient's chest x-ray as well as noted the radiology interpretation:  X-ray: No acute cardiopulmonary process  Official radiology report(s): DG Chest 2 View  Result Date: 11/14/2022 CLINICAL DATA:  Cough EXAM: CHEST - 2 VIEW COMPARISON:  12/29/2019 FINDINGS: The heart size and mediastinal contours are within normal limits. Both lungs are clear. The visualized skeletal structures are unremarkable. IMPRESSION: No active cardiopulmonary disease. Electronically Signed   By: Placido Sou M.D.   On: 11/14/2022 23:36     PROCEDURES:  Critical Care performed: No  Procedures   MEDICATIONS ORDERED IN ED: Medications  predniSONE (DELTASONE) tablet 60 mg (has no administration  in time range)  azithromycin (ZITHROMAX) tablet 500 mg (has no administration in time range)  albuterol (VENTOLIN HFA) 108 (90 Base) MCG/ACT inhaler 2 puff (has no administration in time range)     IMPRESSION / MDM / ASSESSMENT AND PLAN / ED COURSE  I reviewed the triage vital signs and the nursing notes.  22 year old female presenting with cough, congestion, thoracic back pain.  PERC negative.  Respiratory panel and chest x-ray unremarkable.  Will treat with prednisone, Z-Pak and albuterol inhaler.  Patient desires ENT referral for no nasal polyps.  Strict return precautions given.  Patient verbalizes understanding and agrees with plan of care.  Patient's presentation is most consistent with acute, uncomplicated illness.  FINAL CLINICAL IMPRESSION(S) / ED DIAGNOSES   Final diagnoses:  Bronchitis  Nasal polyps     Rx / DC Orders   ED Discharge Orders          Ordered    azithromycin (ZITHROMAX) 250 MG tablet  Daily        11/15/22 0136    predniSONE (DELTASONE) 20 MG tablet        11/15/22 0136    chlorpheniramine-HYDROcodone (TUSSIONEX) 10-8 MG/5ML  Every 12 hours PRN        11/15/22 0136             Note:  This document was prepared using Dragon voice recognition software and may include unintentional dictation errors.   Paulette Blanch, MD 11/15/22 [phone]

## 2022-12-12 ENCOUNTER — Emergency Department
Admission: EM | Admit: 2022-12-12 | Discharge: 2022-12-12 | Disposition: A | Discharge status: Home / Self Care | Payer: Medicaid Other | Attending: Emergency Medicine | Admitting: Emergency Medicine

## 2022-12-12 ENCOUNTER — Emergency Department: Payer: Medicaid Other

## 2022-12-12 DIAGNOSIS — M546 Pain in thoracic spine: Secondary | ICD-10-CM | POA: Diagnosis not present

## 2022-12-12 DIAGNOSIS — M542 Cervicalgia: Secondary | ICD-10-CM | POA: Insufficient documentation

## 2022-12-12 DIAGNOSIS — S0101XA Laceration without foreign body of scalp, initial encounter: Secondary | ICD-10-CM | POA: Insufficient documentation

## 2022-12-12 DIAGNOSIS — Y92410 Unspecified street and highway as the place of occurrence of the external cause: Secondary | ICD-10-CM | POA: Insufficient documentation

## 2022-12-12 DIAGNOSIS — S0990XA Unspecified injury of head, initial encounter: Secondary | ICD-10-CM | POA: Diagnosis present

## 2022-12-12 LAB — POC URINE PREG, ED: Preg Test, Ur: NEGATIVE

## 2022-12-12 NOTE — Discharge Instructions (Addendum)
Your Xray imaging was fortunately all normal.  Please use ibuprofen (Motrin) up to 800 mg every 8 hours, naproxen (Naprosyn) up to 500 mg every 12 hours, and/or acetaminophen (Tylenol) up to 4 g/day for any continued pain.  Please do not use this medication regimen for longer than 7 days
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
## 2022-12-12 NOTE — ED Notes (Signed)
Pt provided with water and aware that we need a urine sample
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
## 2022-12-13 ENCOUNTER — Other Ambulatory Visit: Payer: Self-pay | Admitting: Chiropractor

## 2022-12-13 DIAGNOSIS — S060X0A Concussion without loss of consciousness, initial encounter: Secondary | ICD-10-CM

## 2022-12-14 ENCOUNTER — Ambulatory Visit
Admission: RE | Admit: 2022-12-14 | Discharge: 2022-12-14 | Disposition: A | Discharge status: Home / Self Care | Payer: Medicaid Other | Source: Ambulatory Visit | Attending: Chiropractor | Admitting: Chiropractor

## 2022-12-14 DIAGNOSIS — S060X0A Concussion without loss of consciousness, initial encounter: Secondary | ICD-10-CM | POA: Diagnosis present

## 2023-04-03 ENCOUNTER — Telehealth: Payer: Self-pay | Admitting: Family Medicine

## 2023-04-03 NOTE — Telephone Encounter (Signed)
9:48am Returned call to client . 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 Advised client to contact prescribing MD to let them know to see if any alternative can give. Per client she had already contacted them and they said they could call in nausea medication to take with it,but she states she is not able. Client also stated that [facility] suggested speak to [facility] to f/u.    10:05amPlaced call to [facility] urgent carespoke w/ Victorino Dike. Who states she has spoken with the client this morning and advised her what medications were called in per orders Victorino Dike client is refusing to take the medication prescribed. They discussed with her reaction to the medication was not a true allergy and this is what is prescribed for RMSF. Per Victorino Dike she also advised the client that the [facility] would be reaching out to her regarding RMSF being reportable condition. Requested visit notes and labs be faxed to [facility].    Returned phone call to client to advise client on next steps. Recommended to f/u with PCP or Infectious Disease MD .Provided names of MD's in [place]. Client states she has appt on 7/19 with PCP.

## 2023-04-03 NOTE — Telephone Encounter (Signed)
Please give me a call back Recent visit to the urgent care and I found out I had [facility] fever and they prescribed me a medication I am Allergic to

## 2023-04-05 ENCOUNTER — Ambulatory Visit (INDEPENDENT_AMBULATORY_CARE_PROVIDER_SITE_OTHER): Payer: Medicaid Other | Admitting: Family Medicine

## 2023-04-05 ENCOUNTER — Encounter: Payer: Self-pay | Admitting: Family Medicine

## 2023-04-05 VITALS — BP 113/71 | HR 57 | Temp 97.7°F | Ht 60.0 in | Wt 131.0 lb

## 2023-04-05 DIAGNOSIS — Z91410 Personal history of adult physical and sexual abuse: Secondary | ICD-10-CM

## 2023-04-05 DIAGNOSIS — F1220 Cannabis dependence, uncomplicated: Secondary | ICD-10-CM | POA: Insufficient documentation

## 2023-04-05 DIAGNOSIS — Z87898 Personal history of other specified conditions: Secondary | ICD-10-CM | POA: Insufficient documentation

## 2023-04-05 DIAGNOSIS — F332 Major depressive disorder, recurrent severe without psychotic features: Secondary | ICD-10-CM | POA: Insufficient documentation

## 2023-04-05 DIAGNOSIS — Z8489 Family history of other specified conditions: Secondary | ICD-10-CM | POA: Insufficient documentation

## 2023-04-05 MED ORDER — SERTRALINE HCL 50 MG PO TABS: 50.0000 mg | ORAL_TABLET | Freq: Every day | ORAL | 3 refills | Status: DC
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
## 2023-04-05 NOTE — Assessment & Plan Note (Signed)
Chronic; recently started on elavil 25 mg to assist with sleep at [facility] Recommendation for CBT and community resources Start SSRI to assist Encouraged to focus on self care strategies  I've explained to her that drugs of the SSRI class can have side effects such as weight gain, sexual dysfunction, insomnia, headache, nausea. These medications are generally effective at alleviating symptoms of anxiety and/or depression. Let me know if significant side effects do occur.
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
## 2023-04-05 NOTE — Assessment & Plan Note (Signed)
Working on reduction; notes affect on mood and appetite Body mass index is 25.58 kg/m.

## 2023-04-07 ENCOUNTER — Other Ambulatory Visit: Payer: Self-pay

## 2023-04-07 DIAGNOSIS — A770 Spotted fever due to Rickettsia rickettsii: Secondary | ICD-10-CM | POA: Diagnosis not present

## 2023-04-07 DIAGNOSIS — R112 Nausea with vomiting, unspecified: Secondary | ICD-10-CM | POA: Diagnosis present

## 2023-04-07 LAB — COMPREHENSIVE METABOLIC PANEL
ALT: 31 U/L (ref 0–44)
AST: 39 U/L (ref 15–41)
Albumin: 4.2 g/dL (ref 3.5–5.0)
Alkaline Phosphatase: 73 U/L (ref 38–126)
Anion gap: 8 (ref 5–15)
BUN: 10 mg/dL (ref 6–20)
CO2: 23 mmol/L (ref 22–32)
Calcium: 8.7 mg/dL — ABNORMAL LOW (ref 8.9–10.3)
Chloride: 106 mmol/L (ref 98–111)
Creatinine, Ser: 0.81 mg/dL (ref 0.44–1.00)
GFR, Estimated: >60 mL/min (ref >60)
Glucose, Bld: 106 mg/dL — ABNORMAL HIGH (ref 70–99)
Potassium: 4.1 mmol/L (ref 3.5–5.1)
Sodium: 137 mmol/L (ref 135–145)
Total Bilirubin: 1.1 mg/dL (ref 0.3–1.2)
Total Protein: 7.2 g/dL (ref 6.5–8.1)

## 2023-04-07 LAB — CBC
HCT: 39.1 % (ref 36.0–46.0)
Hemoglobin: 14.3 g/dL (ref 12.0–15.0)
MCH: 33 pg (ref 26.0–34.0)
MCHC: 36.6 g/dL — ABNORMAL HIGH (ref 30.0–36.0)
MCV: 90.3 fL (ref 80.0–100.0)
Platelets: 224 10*3/uL (ref 150–400)
RBC: 4.33 MIL/uL (ref 3.87–5.11)
RDW: 12.6 % (ref 11.5–15.5)
WBC: 10.7 10*3/uL — ABNORMAL HIGH (ref 4.0–10.5)
nRBC: 0 % (ref 0.0–0.2)

## 2023-04-07 NOTE — ED Triage Notes (Signed)
Pt to ed from home via POV for emesis. Pt was seen at an urgent care a couple weeks ago and was diagnosed with RMSF. Pt was prescribed doxy. Pt was unable to take doxy as she is allergic to it. Pt has been trying to get in contact with someone at health dept to get a new med. Health dept didn't prescribed the med, the [facility] did. Pt is caox4, in no acute distress and ambulatory in triage.

## 2023-04-08 ENCOUNTER — Emergency Department
Admission: EM | Admit: 2023-04-08 | Discharge: 2023-04-08 | Disposition: A | Discharge status: Home / Self Care | Payer: Medicaid Other | Attending: Emergency Medicine | Admitting: Emergency Medicine

## 2023-04-08 ENCOUNTER — Ambulatory Visit: Payer: Self-pay

## 2023-04-08 DIAGNOSIS — R112 Nausea with vomiting, unspecified: Secondary | ICD-10-CM

## 2023-04-08 DIAGNOSIS — A770 Spotted fever due to Rickettsia rickettsii: Secondary | ICD-10-CM

## 2023-04-08 LAB — URINALYSIS, ROUTINE W REFLEX MICROSCOPIC
Bacteria, UA: NONE SEEN
Bilirubin Urine: NEGATIVE
Glucose, UA: NEGATIVE mg/dL
Hgb urine dipstick: NEGATIVE
Ketones, ur: 20 mg/dL — AB
Nitrite: NEGATIVE
Protein, ur: NEGATIVE mg/dL
Specific Gravity, Urine: 1.015 (ref 1.005–1.030)
pH: 8 (ref 5.0–8.0)

## 2023-04-08 LAB — POC URINE PREG, ED: Preg Test, Ur: NEGATIVE

## 2023-04-08 MED ORDER — LACTATED RINGERS IV BOLUS
1000.0000 mL | Freq: Once | INTRAVENOUS | Status: AC
  Administered 2023-04-08: 1000 mL via INTRAVENOUS

## 2023-04-08 MED ORDER — PROMETHAZINE HCL 12.5 MG PO TABS: 12.5000 mg | ORAL_TABLET | Freq: Four times a day (QID) | ORAL | 0 refills | Status: DC | PRN

## 2023-04-08 MED ORDER — DOXYCYCLINE HYCLATE 100 MG PO CAPS: 100.0000 mg | ORAL_CAPSULE | Freq: Two times a day (BID) | ORAL | 0 refills | Status: DC

## 2023-04-08 MED ORDER — DOXYCYCLINE HYCLATE 100 MG PO TABS
100.0000 mg | ORAL_TABLET | Freq: Once | ORAL | Status: AC
  Administered 2023-04-08: 100 mg via ORAL
  Filled 2023-04-08: qty 1

## 2023-04-08 MED ORDER — SODIUM CHLORIDE 0.9 % IV SOLN
12.5000 mg | Freq: Once | INTRAVENOUS | Status: AC
  Administered 2023-04-08: 12.5 mg via INTRAVENOUS
  Filled 2023-04-08: qty 12.5

## 2023-04-08 NOTE — ED Provider Notes (Signed)
[facility] Provider Note    Event Date/Time   First MD Initiated Contact with Patient 04/08/23 0221     (approximate)   History   Chief Complaint Emesis   HPI  Tiffany Bush is a 22 y.o. female with past medical history of depression and THC dependence who presents to the ED complaining of nausea and vomiting.  Patient reports that she was initially diagnosed with [facility] spotted fever after visiting urgent care about 2 weeks ago for body aches and malaise.  She states she was prescribed doxycycline but that she cannot take this because it causes her nausea and vomiting.  She denies any associated rash, throat swelling, or difficulty breathing.  She reports frequent episodes of nausea and vomiting whenever trying to take this medication, states she has been taking Zofran to try to help without relief.  She denies any fevers and has not had any joint pains, denies abdominal pain, diarrhea, or dysuria.     Physical Exam   Triage Vital Signs: ED Triage Vitals [04/07/23 2314]  Encounter Vitals Group     BP (!) 149/77     Systolic BP Percentile      Diastolic BP Percentile      Pulse Rate 100     Resp 16     Temp 98 F (36.7 C)     Temp Source Oral     SpO2 100 %     Weight 130 lb 1.1 oz (59 kg)     Height 5' (1.524 m)     Head Circumference      Peak Flow      Pain Score 0     Pain Loc      Pain Education      Exclude from Growth Chart     Most recent vital signs: Vitals:   04/08/23 0400 04/08/23 0430  BP: 121/70 (!) 101/57  Pulse: (!) 55 (!) 48  Resp:  17  Temp:    SpO2: 99% 98%    Constitutional: Alert and oriented. Eyes: Conjunctivae are normal. Head: Atraumatic. Nose: No congestion/rhinnorhea. Mouth/Throat: Mucous membranes are moist.  Cardiovascular: Normal rate, regular rhythm. Grossly normal heart sounds.  2+ radial pulses bilaterally. Respiratory: Normal respiratory effort.  No retractions. Lungs CTAB. Gastrointestinal:  Soft and nontender. No distention. Musculoskeletal: No lower extremity tenderness nor edema.  Neurologic:  Normal speech and language. No gross focal neurologic deficits are appreciated.    ED Results / Procedures / Treatments   Labs (all labs ordered are listed, but only abnormal results are displayed) Labs Reviewed  CBC - Abnormal; Notable for the following components:      Result Value   WBC 10.7 (*)    MCHC 36.6 (*)    All other components within normal limits  COMPREHENSIVE METABOLIC PANEL - Abnormal; Notable for the following components:   Glucose, Bld 106 (*)    Calcium 8.7 (*)    All other components within normal limits  URINALYSIS, ROUTINE W REFLEX MICROSCOPIC - Abnormal; Notable for the following components:   Color, Urine YELLOW (*)    APPearance CLOUDY (*)    Ketones, ur 20 (*)    Leukocytes,Ua TRACE (*)    All other components within normal limits  SPOTTED FEVER GROUP ANTIBODIES  POC URINE PREG, ED    PROCEDURES:  Critical Care performed: No  Procedures   MEDICATIONS ORDERED IN ED: Medications  lactated ringers bolus 1,000 mL (1,000 mLs Intravenous New Bag/Given 04/08/23  0407)  promethazine (PHENERGAN) 12.5 mg in sodium chloride 0.9 % 50 mL IVPB (0 mg Intravenous Stopped 04/08/23 0441)  doxycycline (VIBRA-TABS) tablet 100 mg (100 mg Oral Given 04/08/23 0442)     IMPRESSION / MDM / ASSESSMENT AND PLAN / ED COURSE  I reviewed the triage vital signs and the nursing notes.                              22 y.o. female with past medical history of depression and THC dependence who presents to the ED complaining of persistent nausea and vomiting while attempting to take doxycycline recently prescribed for [facility] spotted fever.  Patient's presentation is most consistent with acute presentation with potential threat to life or bodily function.  Differential diagnosis includes, but is not limited to, dehydration, electrolyte abnormality, AKI, ectopic  pregnancy, UTI, [facility] spotted fever.  Patient nontoxic-appearing and in no acute distress, vital signs are unremarkable.  Her abdomen is soft with no focal tenderness, pregnancy testing is negative.  Labs are reassuring with no significant anemia, leukocytosis, tract abnormality, or AKI.  LFTs are unremarkable, urinalysis pending at this time.  She does not appear to have any current symptoms of [facility] spotted fever.  Unfortunately, doxycycline is the only option for treatment of this outside of chloramphenicol.  She does not appear to have a true allergy to doxycycline as only reaction is reported to be nausea and vomiting.  We will give dose of IV Phenergan and hydrate with IV fluids, attempt dose of doxycycline here in the ED.  Patient feeling better following dose of Phenergan, she is tolerating oral intake without difficulty and had no vomiting following dose of doxycycline.  She is appropriate for outpatient management, repeat testing for [facility] spotted fever is pending at this time and we will refer to infectious disease in case she has ongoing difficulties tolerating doxycycline.  She was counseled to return to the ED for new or worsening symptoms, patient agrees with plan.      FINAL CLINICAL IMPRESSION(S) / ED DIAGNOSES   Final diagnoses:  Nausea and vomiting, unspecified vomiting type  [facility] spotted fever     Rx / DC Orders   ED Discharge Orders          Ordered    promethazine (PHENERGAN) 12.5 MG tablet  Every 6 hours PRN        04/08/23 0603    doxycycline (VIBRAMYCIN) 100 MG capsule  2 times daily        04/08/23 0603             Note:  This document was prepared using Dragon voice recognition software and may include unintentional dictation errors.   Chesley Noon, MD 04/08/23 [phone]

## 2023-04-08 NOTE — Telephone Encounter (Addendum)
  Chief Complaint: Vomiting - weight loss - "Tick Fever" Symptoms: Above Frequency: vomiting 2 weeks - Weight loss 3 months. Pertinent Negatives: Patient denies  Disposition: [x] ED /[] Urgent Care (no appt availability in office) / [] Appointment(In office/virtual)/ []  [facility]/ [] Home Care/ [] Refused Recommended Disposition /[] [facility]/ []  Follow-up with PCP Additional Notes: Call from pt and her mother April. Pt found a tick on herself a few months back. She has lost 60 lbs over the past 3 months. Mother states that pt was diagnosed with"tick Fever. Pt is allergic to Doxycyline. Pt was prescribed doxy at ED last night/this morning. Pt vomits medication immediatly after taking even with medication with Phenergan.  Mother is very anxious about pt's condition. She feels that pt is in danger of dying.She will call EMS for transportation to ED. Mother wants PCP to admit pt to hospital for treatment.    Reason for Disposition  [1] SEVERE vomiting (e.g., 6 or more times/day) AND [2] present > 8 hours (Exception: Patient sounds well, is drinking liquids, does not sound dehydrated, and vomiting has lasted less than 24 hours.)  Answer Assessment - Initial Assessment Questions 1. VOMITING SEVERITY: "How many times have you vomited in the past 24 hours?"     - MILD:  1 - 2 times/day    - MODERATE: 3 - 5 times/day, decreased oral intake without significant weight loss or symptoms of dehydration    - SEVERE: 6 or more times/day, vomits everything or nearly everything, with significant weight loss, symptoms of dehydration      severe 2. ONSET: "When did the vomiting begin?"      2 weeks 3. FLUIDS: "What fluids or food have you vomited up today?" "Have you been able to keep any fluids down?"     NO  Protocols used: Vomiting-A-AH

## 2023-04-10 ENCOUNTER — Telehealth: Payer: Self-pay

## 2023-04-10 ENCOUNTER — Other Ambulatory Visit: Payer: Medicaid Other | Admitting: Licensed Clinical Social Worker

## 2023-04-10 NOTE — Telephone Encounter (Signed)
Spoke with Wilkie Aye at [facility] and requested visit notes and labs for RMSF. Wilkie Aye will send records to [facility] triage.   P: [phone]  Sandie Ano, RN
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
## 2023-04-10 NOTE — Patient Instructions (Signed)
Visit Information  Ms. Blue was given information about Medicaid Managed Care team care coordination services as a part of their Healthy [facility] Medicaid benefit. [name] verbally consented to engagement with the [facility] Managed Care team.   If you are experiencing a medical emergency, please call 911 or report to your local emergency department or urgent care.   If you have a non-emergency medical problem during routine business hours, please contact your provider's office and ask to speak with a nurse.   For questions related to your Healthy [facility] health plan, please call: [phone] or visit the homepage here: [email or web address]  If you would like to schedule transportation through your Healthy [facility] plan, please call the following number at least 2 days in advance of your appointment: [phone]  For information about your ride after you set it up, call Ride Assist at [phone]. Use this number to activate a Will Call pickup, or if your transportation is late for a scheduled pickup. Use this number, too, if you need to make a change or cancel a previously scheduled reservation.  If you need transportation services right away, call [phone]. The after-hours call center is staffed 24 hours to handle ride assistance and urgent reservation requests (including discharges) 365 days a year. Urgent trips include sick visits, hospital discharge requests and life-sustaining treatment.  Call the [facility] Line at [phone], at any time, 24 hours a day, 7 days a week. If you are in danger or need immediate medical attention call 911.  If you would like help to quit smoking, call 1-800-QUIT-NOW (504-592-5730) OR Espaol: 1-855-Djelo-Ya (6-948-546-2703) o para ms informacin haga clic aqu or Text READY to 500-938 to register via text  Following is a copy of your plan of care:  Care Plan : LCSW Plan of Care  Updates  made by Gustavus Bryant, LCSW since 04/10/2023 12:00 AM     Problem: Depression Identification (Depression)      Goal: Depressive Symptoms Identified   Priority: High  Note:   Priority: High  Timeframe:  Short-Range Goal Priority:  High Start Date:  04/10/23         Expected End Date:  ongoing                     Follow Up Date--04/30/23 at 2 pm  - keep 90 percent of scheduled appointments -consider counseling or psychiatry -consider bumping up your self-care  -consider creating a stronger support network   Why is this important?             Combatting depression and stress may take some time.            If you don't feel better right away, don't give up on your treatment plan.    Current barriers:   Chronic Mental Health needs related to depression, stress and anxiety. Patient requires Support, Education, Resources, Referrals, Advocacy, and Care Coordination, in order to meet Unmet Mental Health Needs (To Find a Therapist and Psychiatrist) Patient will implement clinical interventions discussed today to decrease symptoms of depression and increase knowledge and/or ability of: coping skills. Mental Health Concerns and Social Isolation Patient lacks knowledge of available community counseling agencies and resources.  Clinical Goal(s): verbalize understanding of plan for management of Anxiety, Depression, and Stress and demonstrate a reduction in symptoms. Patient will connect with a provider for ongoing mental health treatment, increase coping skills, healthy habits, self-management skills, and stress reduction  Patient Goals/Self-Care Activities: Over the next 120 days Attend scheduled medical appointments Utilize healthy coping skills and supportive resources discussed Contact PCP with any questions or concerns Keep 90 percent of counseling appointments Call your insurance provider for more information about your Enhanced Benefits  Check out counseling resources provided   Begin personal counseling with LCSW, to reduce and manage symptoms of Depression and Stress, until well-established with mental health provider Incorporate into daily practice - relaxation techniques, deep breathing exercises, and mindfulness meditation strategies. Talk about feelings with friends, family members, spiritual advisor, etc. Contact LCSW directly [phone]), if you have questions, need assistance, or if additional social work needs are identified between now and our next scheduled telephone outreach call. Call 988 for mental health hotline/crisis line if needed (24/7 available) Try techniques to reduce symptoms of anxiety/negative thinking (deep breathing, distraction, positive self talk, etc)  - develop a personal safety plan - develop a plan to deal with triggers like holidays, anniversaries - exercise at least 2 to 3 times per week - have a plan for how to handle bad days - journal feelings and what helps to feel better or worse - spend time or talk with others at least 2 to 3 times per week - watch for early signs of feeling worse - begin personal counseling - call and visit an old friend - check out volunteer opportunities - join a support group - laugh; watch a funny movie or comedian - learn and use visualization or guided imagery - perform a random act of kindness - practice relaxation or meditation daily - start or continue a personal journal - practice positive thinking and self-talk -continue with compliance of taking medication  -identify current effective and ineffective coping strategies.  -implement positive self-talk in care to increase self-esteem, confidence and feelings of control.  -consider alternative and complementary therapy approaches such as meditation, mindfulness or yoga.  Call your insurance provider to gain education on benefits if desired Call your primary care doctor if symptoms get worse  -journaling, prayer, worship services,  meditation or pastoral counseling.  -increase participation in pleasurable group activities such as hobbies, singing, sports or volunteering).  -consider the use of meditative movement therapy such as tai chi, yoga or qigong.  -start a regular daily exercise program based on tolerance, ability and patient choice to support positive thinking and activity    If you are experiencing a Mental Health or Behavioral Health Crisis or need someone to talk to, please call the Suicide and Crisis Lifeline: 988    Patient Goals: Initial goal     24- Hour Availability:    [facility]  [address] [place], [place] [phone] Crisis [phone]   [facility] [phone]  [place] [facility]  [phone]    [facility]  [phone] (after hours)   Therapeutic Alternative/Mobile Crisis   [phone]   [place] National Suicide Hotline  (747)004-9349 [place]) [place] [address]   Call 801-414-5494 for mental health emergencies   [facility]  [phone]);  [place] and [employer]  [phone]); [place], [place], [place], [place], [place], [place], [place]    [place] [facility] Urgent Care for [facility] Residents For 24/7 walk-up access to mental health services for [facility] children (4+), adolescents and adults, please visit the [facility] located at [address] in [place], [place].  *[place] also provides comprehensive outpatient behavioral health services  in a variety of locations around the [place].  Connect With [place] [address] [place], [place] [address] HelpLine: (339)031-1204 or 1-(973)731-8307  Get Directions  Find Help 24/7 By Phone Call our 24-hour HelpLine at 907-571-7887 or (408) 549-2433 for immediate assistance for mental health and substance abuse issues.  Walk-In Help [place]: [facility] (Ages 4 and Up) [place]: Emergency Dept., [facility] Additional Resources National Hopeline Network: 1-800-SUICIDE The National Suicide Prevention Lifeline: 1-800-273-TALK     10 LITTLE Things To Do When You're Feeling Too Down To Do Anything  Take a shower. Even if you plan to stay in all day long and not see a soul, take a shower. It takes the most effort to hop in to the shower but once you do, you'll feel immediate results. It will wake you up and you'll be feeling much fresher (and cleaner too).  Brush and floss your teeth. Give your teeth a good brushing with a floss finish. It's a small task but it feels so good and you can check 'taking care of your health' off the list of things to do.  Do something small on your list. Most of [place] have some small thing we would like to get done (load of laundry, sew a button, email a friend). Doing one of these things will make you feel like you've accomplished something.  Drink water. Drinking water is easy right? It's also really beneficial for your health so keep a glass beside you all day and take sips often. It gives you energy and prevents you from boredom eating.  Do some floor exercises. The last thing you want to do is exercise but it might be just the thing you need the most. Keep it simple and do exercises that involve sitting or laying on the floor. Even the smallest of exercises release chemicals in the brain that make you feel good. Yoga stretches or core exercises are going to make you feel good with minimal effort.  Make your bed. Making your bed takes a few minutes but it's productive and you'll feel relieved when it's done. An unmade bed is a huge visual reminder that you're having an unproductive day. Do it and consider it your housework for the day.  Put on some nice clothes. Take the sweatpants off even if you don't plan to go anywhere. Put on clothes that make you feel good. Take a  look in the mirror so your brain recognizes the sweatpants have been replaced with clothes that make you look great. It's an instant confidence booster.  Wash the dishes. A pile of dirty dishes in the sink is a reflection of your mood. It's possible that if you wash up the dishes, your mood will follow suit. It's worth a try.  Cook a real meal. If you have the luxury to have a "do nothing" day, you have time to make a real meal for yourself. Make a meal that you love to eat. The process is good to get you out of the funk and the food will ensure you have more energy for tomorrow.  Write out your thoughts by hand. When you hand write, you stimulate your brain to focus on the moment that you're in so make yourself comfortable and write whatever comes into your mind. Put those thoughts out on paper so they stop spinning around in your head. Those thoughts might be the very thing holding you down.  Dickie La, BSW, MSW,  LCSW Managed Medicaid LCSW [place]  [facility] [email or web address] Phone: [phone]

## 2023-04-10 NOTE — Telephone Encounter (Signed)
Records received and faxed to Dr. Rivka Safer at [phone].  Sandie Ano, RN

## 2023-04-11 ENCOUNTER — Ambulatory Visit
Admission: RE | Admit: 2023-04-11 | Discharge: 2023-04-11 | Disposition: A | Discharge status: Home / Self Care | Payer: Medicaid Other | Source: Ambulatory Visit | Attending: Infectious Diseases | Admitting: Infectious Diseases

## 2023-04-11 ENCOUNTER — Encounter: Payer: Self-pay | Admitting: Infectious Diseases

## 2023-04-11 ENCOUNTER — Other Ambulatory Visit
Admission: RE | Admit: 2023-04-11 | Discharge: 2023-04-11 | Disposition: A | Discharge status: Home / Self Care | Payer: Medicaid Other | Source: Ambulatory Visit | Attending: Infectious Diseases | Admitting: Infectious Diseases

## 2023-04-11 ENCOUNTER — Ambulatory Visit: Payer: Medicaid Other | Attending: Infectious Diseases | Admitting: Infectious Diseases

## 2023-04-11 VITALS — BP 129/73 | HR 66 | Temp 97.2°F | Ht 60.0 in | Wt 124.0 lb

## 2023-04-11 DIAGNOSIS — Z91018 Allergy to other foods: Secondary | ICD-10-CM

## 2023-04-11 DIAGNOSIS — R112 Nausea with vomiting, unspecified: Secondary | ICD-10-CM | POA: Diagnosis not present

## 2023-04-11 DIAGNOSIS — F419 Anxiety disorder, unspecified: Secondary | ICD-10-CM | POA: Diagnosis not present

## 2023-04-11 DIAGNOSIS — A7741 Ehrlichiosis chafeensis [E. chafeensis]: Secondary | ICD-10-CM

## 2023-04-11 DIAGNOSIS — R5383 Other fatigue: Secondary | ICD-10-CM | POA: Diagnosis not present

## 2023-04-11 DIAGNOSIS — F32A Depression, unspecified: Secondary | ICD-10-CM | POA: Diagnosis not present

## 2023-04-11 DIAGNOSIS — R634 Abnormal weight loss: Secondary | ICD-10-CM | POA: Diagnosis not present

## 2023-04-11 DIAGNOSIS — Z6824 Body mass index (BMI) 24.0-24.9, adult: Secondary | ICD-10-CM | POA: Insufficient documentation

## 2023-04-11 LAB — HIV ANTIBODY (ROUTINE TESTING W REFLEX): HIV Screen 4th Generation wRfx: NONREACTIVE

## 2023-04-11 MED ORDER — SODIUM CHLORIDE 0.9 % IV SOLN: 4.0000 mg | Freq: Two times a day (BID) | INTRAVENOUS | Status: AC

## 2023-04-11 MED ORDER — SODIUM CHLORIDE 0.9 % IV SOLN
100.0000 mg | Freq: Two times a day (BID) | INTRAVENOUS | Status: DC
  Administered 2023-04-11: 100 mg via INTRAVENOUS
  Filled 2023-04-11 (×4): qty 100

## 2023-04-11 MED ORDER — ONDANSETRON HCL 4 MG/2ML IJ SOLN
4.0000 mg | Freq: Two times a day (BID) | INTRAMUSCULAR | Status: DC
  Administered 2023-04-11: 4 mg via INTRAVENOUS

## 2023-04-11 MED ORDER — ONDANSETRON HCL 4 MG/2ML IJ SOLN
INTRAMUSCULAR | Status: AC
  Filled 2023-04-11: qty 2

## 2023-04-11 MED ORDER — DOXYCYCLINE HYCLATE 100 MG IV SOLR: 100.0000 mg | Freq: Two times a day (BID) | INTRAVENOUS | Status: AC

## 2023-04-11 NOTE — Progress Notes (Signed)
NAME: Tiffany Bush  DOB: 08/24/2001  MRN: [id number]  Date/Time: 04/11/2023 9:11 AM  Subjective:  Patient is here with her mother.  She is referred by her PCP Robynn Pane payne. ? Tiffany Bush is a 22 y.o. female with a history of depression and, anxiety, renal stone, headache, is seeing me for tickborne illness Patient in the second week of June was in the river and later noted to ticks which she removed from her body.  A week or so later she developed headaches and body ache and was taking BC powder She felt feverish but did not check temperature.  She developed aversion to meat.  And was starting to lose weight The background history is patient has had depression which she states is due to childhood trauma and started losing weight since last December.  Last year she used to be 180 pounds and now she is around 130.  She has not been eating well even before the tick bite from last December but as per mom's gotten worse in the last 6 weeks.  Patient went to next care on March 28, 2023 and was checked for Lyme, [facility] spotted fever and early Kia She was placed on doxycycline [facility] spotted fever IgM was 1 in 64-IgG was negative  and Ehrlichia IgG was 1 in256 and IgM was negative She is referred to me because she is unable to tolerate the p.o. doxycycline has been throwing up and had recently gone to the emergency room for that  Past Medical History:  Diagnosis Date   Anxiety    no meds    Asthma    well controlled-exercise induced   Complication of anesthesia    Family history of adverse reaction to anesthesia    mom-n/v   Headache    more frequent during the summer with her allergies   Kidney stones    PONV (postoperative nausea and vomiting)    nausea    Past Surgical History:  Procedure Laterality Date   CYSTOSCOPY     LAPAROSCOPY N/A 04/14/2018   Procedure: LAPAROSCOPY DIAGNOSTIC;  Surgeon: Suzy Bouchard, MD;  Location: [facility] ORS;  Service: Gynecology;   Laterality: N/A;   LAPAROSCOPY N/A 04/14/2018   Procedure: LAPAROSCOPY OPERATIVE;  Surgeon: Schermerhorn, Ihor Austin, MD;  Location: [facility] ORS;  Service: Gynecology;  Laterality: N/A;   TONSILLECTOMY     age 8    Social History   Socioeconomic History   Marital status: Single    Spouse name: Not on file   Number of children: Not on file   Years of education: Not on file   Highest education level: Not on file  Occupational History   Not on file  Tobacco Use   Smoking status: Never   Smokeless tobacco: Never  Vaping Use   Vaping status: Never Used  Substance and Sexual Activity   Alcohol use: Yes    Comment: occ   Drug use: Yes    Types: Marijuana   Sexual activity: Yes    Birth control/protection: None  Other Topics Concern   Not on file  Social History Narrative   Not on file   Social Determinants of Health   Financial Resource Strain: Not on file  Food Insecurity: Not on file  Transportation Needs: Not on file  Physical Activity: Not on file  Stress: Stress Concern Present (04/10/2023)   [employer] of Occupational Health - Occupational Stress Questionnaire    Feeling of Stress : Very much  Social Connections: Not on file  Intimate Partner Violence: Not on file    No family history on file. Allergies  Allergen Reactions   Doxycycline Nausea And Vomiting   Latex Rash   I? Current Outpatient Medications  Medication Sig Dispense Refill   promethazine (PHENERGAN) 12.5 MG tablet Take 1 tablet (12.5 mg total) by mouth every 6 (six) hours as needed for nausea or vomiting. 14 tablet 0   sertraline (ZOLOFT) 50 MG tablet Take 1 tablet (50 mg total) by mouth daily. 30 tablet 3   No current facility-administered medications for this visit.     Abtx:  Anti-infectives (From admission, onward)    None       REVIEW OF SYSTEMS:  Const: negative fever, negative chills,++ weight loss Eyes: negative diplopia or visual changes, negative eye pain ENT: negative  coryza, negative sore throat Resp: negative cough, hemoptysis, dyspnea Cards: negative for chest pain, palpitations, lower extremity edema GU: negative for frequency, dysuria and hematuria, some vaginal discharge GI: Lower abdominal pain which resolved after taking 1 dose of doxycycline Skin: negative for rash and pruritus Heme: negative for easy bruising and gum/nose bleeding MS: Fatigue, weakness Neurolo:++headaches, dizziness,  Psych: anxiety, depression  Endocrine: negative for thyroid, diabetes Allergy/Immunology-nausea and vomiting due to doxycycline is more a side effect rather than allergy Objective:  VITALS:  BP 129/73   Pulse 66   Temp (!) 97.2 F (36.2 C) (Temporal)   Ht 5' (1.524 m)   Wt 124 lb (56.2 kg)   LMP 03/18/2023   BMI 24.22 kg/m   PHYSICAL EXAM:  General: Alert, cooperative, no distress, appears stated age.  Head: Normocephalic, without obvious abnormality, atraumatic. Eyes: Conjunctivae clear, anicteric sclerae. Pupils are equal ENT Nares normal. No drainage or sinus tenderness. Lips, mucosa, and tongue normal. No Thrush Neck: Supple, symmetrical, no adenopathy, thyroid: non tender no carotid bruit and no JVD. Back: No CVA tenderness. Lungs: Clear to auscultation bilaterally. No Wheezing or Rhonchi. No rales. Heart: Regular rate and rhythm, no murmur, rub or gallop. Abdomen: Soft, non-tender,not distended. Bowel sounds normal. No masses Extremities: atraumatic, no cyanosis. No edema. No clubbing Skin: Acneform eruption over face lymph: Cervical, supraclavicular normal. Neurologic: Grossly non-focal Pertinent Labs Lab Results CBC    Component Value Date/Time   WBC 10.7 (H) 04/07/2023 2316   RBC 4.33 04/07/2023 2316   HGB 14.3 04/07/2023 2316   HGB 13.5 08/19/2014 0954   HCT 39.1 04/07/2023 2316   HCT 40.6 08/19/2014 0954   PLT 224 04/07/2023 2316   PLT 270 08/19/2014 0954   MCV 90.3 04/07/2023 2316   MCV 91 08/19/2014 0954   MCH 33.0  04/07/2023 2316   MCHC 36.6 (H) 04/07/2023 2316   RDW 12.6 04/07/2023 2316   RDW 13.2 08/19/2014 0954   LYMPHSABS 1.7 05/23/2022 0108   MONOABS 0.6 05/23/2022 0108   EOSABS 0.2 05/23/2022 0108   BASOSABS 0.1 05/23/2022 0108       Latest Ref Rng & Units 04/07/2023   11:16 PM 05/23/2022    1:08 AM 08/22/2021    5:21 AM  CMP  Glucose 70 - 99 mg/dL 782  91  956   BUN 6 - 20 mg/dL 10  5  7    Creatinine 0.44 - 1.00 mg/dL 2.13  0.86  5.78   Sodium 135 - 145 mmol/L 137  140  139   Potassium 3.5 - 5.1 mmol/L 4.1  3.3  4.7   Chloride 98 - 111 mmol/L 106  110  106   CO2 22 - 32 mmol/L 23  24  28    Calcium 8.9 - 10.3 mg/dL 8.7  8.7  8.6   Total Protein 6.5 - 8.1 g/dL 7.2  7.0  6.1   Total Bilirubin 0.3 - 1.2 mg/dL 1.1  0.9  0.5   Alkaline Phos 38 - 126 U/L 73  63  68   AST 15 - 41 U/L 39  20  31   ALT 0 - 44 U/L 31  20  33   03/28/2023 Ehrlichia IgG 1: 256  ? 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 So will treat with IV doxy along with Zofran Pt will come to day surgery and get IV doxy and Iv zofran Will also check Ehrlichia PCR, IgG antibodies, Alpha gal  She does not have Rickettsial infection  Discussed with patient and her mother in detail  Also discussed with day surgery staff Follow up 1 week  Note:  This document was prepared using Dragon voice recognition software and may include unintentional dictation errors.

## 2023-04-11 NOTE — Patient Instructions (Signed)
You are here for a tick bite 2 months and having positive antibodies for ehrlichia. Today will do some labs

## 2023-04-12 ENCOUNTER — Ambulatory Visit
Admission: RE | Admit: 2023-04-12 | Discharge: 2023-04-12 | Disposition: A | Discharge status: Home / Self Care | Payer: Medicaid Other | Source: Ambulatory Visit | Attending: Infectious Diseases | Admitting: Infectious Diseases

## 2023-04-12 ENCOUNTER — Ambulatory Visit: Admission: RE | Admit: 2023-04-12 | Payer: No Typology Code available for payment source | Source: Ambulatory Visit

## 2023-04-12 DIAGNOSIS — A770 Spotted fever due to Rickettsia rickettsii: Secondary | ICD-10-CM | POA: Insufficient documentation

## 2023-04-12 DIAGNOSIS — R112 Nausea with vomiting, unspecified: Secondary | ICD-10-CM | POA: Insufficient documentation

## 2023-04-12 MED ORDER — ONDANSETRON HCL 4 MG/2ML IJ SOLN
4.0000 mg | Freq: Once | INTRAMUSCULAR | Status: AC
  Administered 2023-04-12: 4 mg via INTRAVENOUS

## 2023-04-12 MED ORDER — ONDANSETRON HCL 4 MG/2ML IJ SOLN
INTRAMUSCULAR | Status: AC
  Filled 2023-04-12: qty 2

## 2023-04-12 MED ORDER — ONDANSETRON HCL 4 MG/2ML IJ SOLN
4.0000 mg | Freq: Two times a day (BID) | INTRAMUSCULAR | Status: DC
  Administered 2023-04-12: 4 mg via INTRAVENOUS

## 2023-04-12 MED ORDER — SODIUM CHLORIDE 0.9 % IV SOLN
100.0000 mg | Freq: Two times a day (BID) | INTRAVENOUS | Status: DC
  Administered 2023-04-12: 100 mg via INTRAVENOUS
  Filled 2023-04-12 (×2): qty 100

## 2023-04-12 MED ORDER — SODIUM CHLORIDE 0.9 % IV SOLN
100.0000 mg | Freq: Once | INTRAVENOUS | Status: AC
  Administered 2023-04-12: 100 mg via INTRAVENOUS
  Filled 2023-04-12: qty 100

## 2023-04-12 MED ORDER — SODIUM CHLORIDE 0.9 % IV SOLN
100.0000 mg | Freq: Two times a day (BID) | INTRAVENOUS | Status: DC
  Filled 2023-04-12 (×3): qty 100

## 2023-04-13 ENCOUNTER — Ambulatory Visit
Admission: RE | Admit: 2023-04-13 | Discharge: 2023-04-13 | Disposition: A | Discharge status: Home / Self Care | Payer: Medicaid Other | Source: Ambulatory Visit | Attending: Infectious Diseases | Admitting: Infectious Diseases

## 2023-04-13 DIAGNOSIS — Z01818 Encounter for other preprocedural examination: Secondary | ICD-10-CM | POA: Insufficient documentation

## 2023-04-13 MED ORDER — SODIUM CHLORIDE 0.9 % IV SOLN
100.0000 mg | Freq: Two times a day (BID) | INTRAVENOUS | Status: DC
  Administered 2023-04-13: 100 mg via INTRAVENOUS
  Filled 2023-04-13 (×2): qty 100

## 2023-04-13 MED ORDER — SODIUM CHLORIDE 0.9 % IV SOLN
100.0000 mg | Freq: Two times a day (BID) | INTRAVENOUS | Status: DC
  Administered 2023-04-14: 100 mg via INTRAVENOUS
  Filled 2023-04-13: qty 100

## 2023-04-13 MED ORDER — SODIUM CHLORIDE 0.9 % IV SOLN
100.0000 mg | Freq: Two times a day (BID) | INTRAVENOUS | Status: DC
  Administered 2023-04-13: 100 mg via INTRAVENOUS
  Filled 2023-04-13: qty 100

## 2023-04-13 MED ORDER — ONDANSETRON HCL 4 MG/2ML IJ SOLN
4.0000 mg | Freq: Two times a day (BID) | INTRAMUSCULAR | Status: DC
  Administered 2023-04-13: 4 mg via INTRAVENOUS

## 2023-04-14 ENCOUNTER — Ambulatory Visit
Admission: RE | Admit: 2023-04-14 | Discharge: 2023-04-14 | Disposition: A | Discharge status: Home / Self Care | Payer: Medicaid Other | Source: Ambulatory Visit | Attending: Infectious Diseases | Admitting: Infectious Diseases

## 2023-04-14 DIAGNOSIS — Z91018 Allergy to other foods: Secondary | ICD-10-CM | POA: Insufficient documentation

## 2023-04-14 DIAGNOSIS — A7741 Ehrlichiosis chafeensis [E. chafeensis]: Secondary | ICD-10-CM | POA: Insufficient documentation

## 2023-04-14 MED ORDER — ONDANSETRON HCL 4 MG/2ML IJ SOLN: 4.0000 mg | Freq: Once | INTRAMUSCULAR | Status: DC

## 2023-04-14 MED ORDER — ONDANSETRON HCL 4 MG/2ML IJ SOLN
4.0000 mg | INTRAMUSCULAR | Status: DC
  Administered 2023-04-14: 4 mg via INTRAVENOUS

## 2023-04-14 MED ORDER — ONDANSETRON HCL 4 MG/2ML IJ SOLN
4.0000 mg | Freq: Once | INTRAMUSCULAR | Status: AC
  Administered 2023-04-14: 4 mg via INTRAVENOUS

## 2023-04-14 MED ORDER — SODIUM CHLORIDE 0.9 % IV SOLN
100.0000 mg | INTRAVENOUS | Status: DC
  Administered 2023-04-14: 100 mg via INTRAVENOUS
  Filled 2023-04-14: qty 100

## 2023-04-14 MED ORDER — ONDANSETRON HCL 4 MG/2ML IJ SOLN
INTRAMUSCULAR | Status: AC
  Filled 2023-04-14: qty 2

## 2023-04-15 ENCOUNTER — Ambulatory Visit
Admission: RE | Admit: 2023-04-15 | Discharge: 2023-04-15 | Disposition: A | Discharge status: Home / Self Care | Payer: Medicaid Other | Source: Ambulatory Visit | Attending: Infectious Diseases | Admitting: Infectious Diseases

## 2023-04-15 DIAGNOSIS — A770 Spotted fever due to Rickettsia rickettsii: Secondary | ICD-10-CM | POA: Insufficient documentation

## 2023-04-15 MED ORDER — SODIUM CHLORIDE 0.9 % IV SOLN
100.0000 mg | Freq: Once | INTRAVENOUS | Status: AC
  Administered 2023-04-15: 100 mg via INTRAVENOUS
  Filled 2023-04-15: qty 100

## 2023-04-15 MED ORDER — ONDANSETRON HCL 4 MG/2ML IJ SOLN
4.0000 mg | Freq: Once | INTRAMUSCULAR | Status: AC
  Administered 2023-04-15: 4 mg via INTRAVENOUS

## 2023-04-15 MED ORDER — SODIUM CHLORIDE 0.9 % IV SOLN
100.0000 mg | Freq: Two times a day (BID) | INTRAVENOUS | Status: DC
  Filled 2023-04-15 (×2): qty 100

## 2023-04-15 MED ORDER — ONDANSETRON HCL 4 MG/2ML IJ SOLN
4.0000 mg | Freq: Once | INTRAMUSCULAR | Status: AC
  Administered 2023-04-18: 4 mg via INTRAVENOUS

## 2023-04-15 MED ORDER — ONDANSETRON HCL 4 MG/2ML IJ SOLN: 4.0000 mg | Freq: Two times a day (BID) | INTRAMUSCULAR | Status: DC

## 2023-04-15 MED ORDER — SODIUM CHLORIDE 0.9 % IV SOLN
100.0000 mg | Freq: Once | INTRAVENOUS | Status: DC
  Filled 2023-04-15: qty 100

## 2023-04-15 MED ORDER — ONDANSETRON HCL 4 MG/2ML IJ SOLN
INTRAMUSCULAR | Status: AC
  Filled 2023-04-15: qty 2

## 2023-04-16 ENCOUNTER — Ambulatory Visit: Admission: RE | Admit: 2023-04-16 | Payer: No Typology Code available for payment source | Source: Ambulatory Visit

## 2023-04-16 ENCOUNTER — Ambulatory Visit
Admission: RE | Admit: 2023-04-16 | Discharge: 2023-04-16 | Disposition: A | Discharge status: Home / Self Care | Payer: Medicaid Other | Source: Ambulatory Visit | Attending: Infectious Diseases | Admitting: Infectious Diseases

## 2023-04-16 DIAGNOSIS — A7741 Ehrlichiosis chafeensis [E. chafeensis]: Secondary | ICD-10-CM | POA: Insufficient documentation

## 2023-04-16 MED ORDER — SODIUM CHLORIDE 0.9 % IV SOLN
100.0000 mg | Freq: Two times a day (BID) | INTRAVENOUS | Status: DC
  Administered 2023-04-16: 100 mg via INTRAVENOUS
  Filled 2023-04-16: qty 100

## 2023-04-17 ENCOUNTER — Ambulatory Visit
Admission: RE | Admit: 2023-04-17 | Discharge: 2023-04-17 | Disposition: A | Discharge status: Home / Self Care | Payer: Medicaid Other | Source: Ambulatory Visit | Attending: Infectious Diseases | Admitting: Infectious Diseases

## 2023-04-17 DIAGNOSIS — A7741 Ehrlichiosis chafeensis [E. chafeensis]: Secondary | ICD-10-CM | POA: Insufficient documentation

## 2023-04-17 DIAGNOSIS — R112 Nausea with vomiting, unspecified: Secondary | ICD-10-CM | POA: Diagnosis not present

## 2023-04-17 MED ORDER — ONDANSETRON HCL 4 MG/2ML IJ SOLN
4.0000 mg | Freq: Once | INTRAMUSCULAR | Status: AC
  Administered 2023-04-17: 4 mg via INTRAVENOUS

## 2023-04-17 MED ORDER — SODIUM CHLORIDE 0.9 % IV SOLN
100.0000 mg | Freq: Two times a day (BID) | INTRAVENOUS | Status: DC
  Administered 2023-04-17: 100 mg via INTRAVENOUS
  Filled 2023-04-17: qty 100

## 2023-04-17 MED ORDER — SODIUM CHLORIDE 0.9 % IV SOLN
100.0000 mg | Freq: Two times a day (BID) | INTRAVENOUS | Status: DC
  Administered 2023-04-17: 100 mg via INTRAVENOUS
  Filled 2023-04-17 (×3): qty 100

## 2023-04-17 MED ORDER — ONDANSETRON HCL 4 MG/2ML IJ SOLN
INTRAMUSCULAR | Status: AC
  Filled 2023-04-17: qty 2

## 2023-04-18 ENCOUNTER — Ambulatory Visit
Admission: RE | Admit: 2023-04-18 | Discharge: 2023-04-18 | Disposition: A | Discharge status: Home / Self Care | Payer: Medicaid Other | Source: Ambulatory Visit | Attending: Infectious Diseases | Admitting: Infectious Diseases

## 2023-04-18 ENCOUNTER — Ambulatory Visit: Payer: Medicaid Other | Attending: Infectious Diseases | Admitting: Infectious Diseases

## 2023-04-18 DIAGNOSIS — A7741 Ehrlichiosis chafeensis [E. chafeensis]: Secondary | ICD-10-CM | POA: Insufficient documentation

## 2023-04-18 DIAGNOSIS — A770 Spotted fever due to Rickettsia rickettsii: Secondary | ICD-10-CM | POA: Insufficient documentation

## 2023-04-18 DIAGNOSIS — R112 Nausea with vomiting, unspecified: Secondary | ICD-10-CM | POA: Diagnosis present

## 2023-04-18 MED ORDER — SODIUM CHLORIDE 0.9 % IV SOLN
100.0000 mg | Freq: Once | INTRAVENOUS | Status: AC
  Administered 2023-04-18: 100 mg via INTRAVENOUS
  Filled 2023-04-18: qty 100

## 2023-04-18 MED ORDER — ONDANSETRON HCL 4 MG/2ML IJ SOLN
4.0000 mg | Freq: Once | INTRAMUSCULAR | Status: AC
  Administered 2023-04-18: 4 mg via INTRAVENOUS

## 2023-04-18 MED ORDER — ONDANSETRON HCL 4 MG/2ML IJ SOLN
INTRAMUSCULAR | Status: AC
  Filled 2023-04-18: qty 2

## 2023-04-19 ENCOUNTER — Ambulatory Visit
Admission: RE | Admit: 2023-04-19 | Discharge: 2023-04-19 | Disposition: A | Discharge status: Home / Self Care | Payer: Medicaid Other | Source: Ambulatory Visit | Attending: Infectious Diseases | Admitting: Infectious Diseases

## 2023-04-19 DIAGNOSIS — A7741 Ehrlichiosis chafeensis [E. chafeensis]: Secondary | ICD-10-CM | POA: Insufficient documentation

## 2023-04-19 DIAGNOSIS — R112 Nausea with vomiting, unspecified: Secondary | ICD-10-CM | POA: Diagnosis present

## 2023-04-19 MED ORDER — ONDANSETRON HCL 4 MG/2ML IJ SOLN
INTRAMUSCULAR | Status: AC
  Filled 2023-04-19: qty 2

## 2023-04-19 MED ORDER — ONDANSETRON HCL 4 MG/2ML IJ SOLN
4.0000 mg | Freq: Once | INTRAMUSCULAR | Status: AC
  Administered 2023-04-19: 4 mg via INTRAVENOUS

## 2023-04-19 MED ORDER — SODIUM CHLORIDE 0.9 % IV SOLN
100.0000 mg | Freq: Once | INTRAVENOUS | Status: AC
  Administered 2023-04-19: 100 mg via INTRAVENOUS
  Filled 2023-04-19: qty 100

## 2023-04-30 ENCOUNTER — Other Ambulatory Visit: Payer: Medicaid Other | Admitting: Licensed Clinical Social Worker

## 2023-04-30 NOTE — Patient Outreach (Addendum)
  Medicaid Managed Care   Unsuccessful Attempt Note   04/30/2023 Name: Tiffany Bush MRN: [id number] DOB: 09-Nov-2000  Referred by: Jacky Kindle, FNP Reason for referral : No chief complaint on file.   An unsuccessful telephone outreach was attempted today. The patient was referred to the case management team for assistance with care management and care coordination.  Number was not in service, no way to leave a voice message after two attempts.   Incoming return call from mother was placed. Mother Alease Frame states that Heavynn started a new job today and is there now and mother is unsure of her new schedule and hours so will have patient contact this [facility] LCSW back in order to reschedule. [facility] LCSW will update [facility] Scheduling Care Guide.   Follow Up Plan: The Managed Medicaid care management team will reach out to the patient again over the next 30 days.   Dickie La, BSW, MSW, [employer] Managed Medicaid LCSW [facility]  [facility] [email or web address] Phone: [phone]

## 2023-04-30 NOTE — Patient Instructions (Signed)
[name] ,   The [facility] Managed Care Team is available to provide assistance to you with your healthcare needs at no cost and as a benefit of your [facility] Health plan. I'm sorry I was unable to reach you today for our scheduled appointment. Our care guide will call you to reschedule our telephone appointment. Please call me at the number below. I am available to be of assistance to you regarding your healthcare needs. .   Thank you,   Dickie La, BSW, MSW, LCSW Managed Medicaid LCSW [facility]  [address] [email or web address] Phone: [phone]

## 2023-05-06 ENCOUNTER — Ambulatory Visit: Payer: Medicaid Other | Admitting: Family Medicine

## 2023-05-15 ENCOUNTER — Ambulatory Visit: Payer: Medicaid Other | Admitting: Family Medicine

## 2023-05-16 ENCOUNTER — Telehealth: Payer: Self-pay

## 2023-05-16 ENCOUNTER — Ambulatory Visit (INDEPENDENT_AMBULATORY_CARE_PROVIDER_SITE_OTHER): Payer: Medicaid Other | Admitting: Family Medicine

## 2023-05-16 DIAGNOSIS — Z91199 Patient's noncompliance with other medical treatment and regimen due to unspecified reason: Secondary | ICD-10-CM

## 2023-05-16 NOTE — Telephone Encounter (Signed)
.   Medicaid Managed Care   Unsuccessful Outreach Note  05/16/2023 Name: Tiffany Bush MRN: [id number] DOB: [birth year]  Referred by: Jacky Kindle, FNP Reason for referral : Appointment   A second unsuccessful telephone outreach was attempted today. The patient was referred to the case management team for assistance with care management and care coordination.   Follow Up Plan: The care management team will reach out to the patient again over the next 7 days.   Weston Settle Care Guide  [facility] Managed  Care Guide [facility]  [phone]

## 2023-05-16 NOTE — Progress Notes (Signed)
Patient was not seen for appt d/t no call, no show, or late arrival >10 mins past appt time.   Elise T Payne, FNP  [facility] [address] [place], [place] [address] [phone] (phone) [phone] (fax) [facility]  

## 2023-05-24 ENCOUNTER — Ambulatory Visit: Payer: Medicaid Other | Admitting: Family Medicine

## 2023-05-24 ENCOUNTER — Encounter: Payer: Self-pay | Admitting: Family Medicine

## 2023-05-24 VITALS — BP 118/84 | HR 57 | Ht 60.0 in | Wt 128.0 lb

## 2023-05-24 DIAGNOSIS — R4589 Other symptoms and signs involving emotional state: Secondary | ICD-10-CM | POA: Insufficient documentation

## 2023-05-24 DIAGNOSIS — Z8489 Family history of other specified conditions: Secondary | ICD-10-CM | POA: Diagnosis not present

## 2023-05-24 DIAGNOSIS — F333 Major depressive disorder, recurrent, severe with psychotic symptoms: Secondary | ICD-10-CM | POA: Diagnosis not present

## 2023-05-24 DIAGNOSIS — F1220 Cannabis dependence, uncomplicated: Secondary | ICD-10-CM

## 2023-05-24 MED ORDER — TRAZODONE HCL 50 MG PO TABS: 25.0000 mg | ORAL_TABLET | Freq: Every evening | ORAL | 3 refills | Status: DC | PRN

## 2023-05-24 MED ORDER — HYDROXYZINE PAMOATE 25 MG PO CAPS: 25.0000 mg | ORAL_CAPSULE | Freq: Three times a day (TID) | ORAL | 0 refills | Status: DC | PRN

## 2023-05-24 NOTE — Assessment & Plan Note (Signed)
Chronic, daily  Reports she does not want to smoke but it helps her cope with her environment and her family

## 2023-05-24 NOTE — Assessment & Plan Note (Signed)
Acute; since start of Zoloft Has since discontinued Continues to have desired to feel normal and happy and not have concerns of hurting herself (when on meds) or not wanting to be here Verbally contracted that patient does not have an active plan
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
## 2023-05-24 NOTE — Patient Instructions (Addendum)
[facility] Helpline Call [phone] or Text [phone] Email: [email or web address]@[facility] [email or web address] Monday - Friday, 8:30 am - 5:00 pm  Suicide & Crisis Hotline Call 988 or Text 988  Seek emergent care or call 9-1-1  [facility] serves 6 counties in [place]: [place], [place], [place], [place], [address], and [place]. Call [phone] for [facility].

## 2023-05-24 NOTE — Assessment & Plan Note (Addendum)
Chronic, worsening Reports due to substance use that she has concerns for doing things and forgetting that they were done and not saying the right things Secondary referrals and emergent numbers provided to assist  Defer start of daily medication until seen by psych due to side effects with SSRI Trial of PRN medication to assist with anxiety and sleep deficit concerns; follow up in 1 month recommended

## 2023-05-24 NOTE — Assessment & Plan Note (Signed)
Chronic, worsening Reports poor communication styles and yelling and fighting Reports no place to go to seek refuge Encouraged to seek mental health treatment and try to find a job to get out of the toxic environment

## 2023-05-29 ENCOUNTER — Other Ambulatory Visit: Payer: Medicaid Other | Admitting: Licensed Clinical Social Worker

## 2023-05-29 NOTE — Patient Instructions (Signed)
Tiffany Bush ,   The [facility] Managed Care Team is available to provide assistance to you with your healthcare needs at no cost and as a benefit of your [facility] Health plan. I'm sorry I was unable to reach you today for our scheduled appointment. Our care guide will call you to reschedule our telephone appointment. Please call me at the number below. I am available to be of assistance to you regarding your healthcare needs. .   Thank you,   Dickie La, BSW, MSW, LCSW Managed Medicaid LCSW [facility]  [address] [email or web address] Phone: [phone]

## 2023-05-29 NOTE — Patient Outreach (Signed)
  Medicaid Managed Care   Unsuccessful Attempt Note   05/29/2023 Name: Tiffany Bush MRN: [id number] DOB: 2000/12/17  Referred by: Jacky Kindle, FNP Reason for referral : No chief complaint on file.   Third unsuccessful telephone outreach was attempted today. The patient was referred to the case management team for assistance with care management and care coordination. The patient's primary care provider has been notified of our unsuccessful attempts to make or maintain contact with the patient. The care management team is pleased to engage with this patient at any time in the future should he/she be interested in assistance from the care management team.    Follow Up Plan: The Managed Medicaid care management team is available to follow up with the patient after provider conversation with the patient regarding recommendation for care management engagement and subsequent re-referral to the care management team.    Dickie La, BSW, MSW, LCSW Managed Medicaid LCSW [facility]  [facility] [email or web address] Phone: [phone]

## 2023-06-15 ENCOUNTER — Other Ambulatory Visit: Payer: Self-pay | Admitting: Family Medicine

## 2023-06-25 ENCOUNTER — Ambulatory Visit (INDEPENDENT_AMBULATORY_CARE_PROVIDER_SITE_OTHER): Payer: Medicaid Other | Admitting: Family Medicine

## 2023-06-25 DIAGNOSIS — Z91199 Patient's noncompliance with other medical treatment and regimen due to unspecified reason: Secondary | ICD-10-CM

## 2023-06-25 NOTE — Progress Notes (Signed)
Patient was not seen for appt d/t no call, no show, or late arrival >10 mins past appt time.   Elise T Payne, FNP  [facility] [address] [place], [place] [address] [phone] (phone) [phone] (fax) [facility]  

## 2023-07-16 ENCOUNTER — Ambulatory Visit: Payer: Medicaid Other | Admitting: Psychiatry

## 2023-08-09 ENCOUNTER — Ambulatory Visit: Payer: Medicaid Other | Admitting: Family Medicine

## 2023-08-27 ENCOUNTER — Ambulatory Visit (INDEPENDENT_AMBULATORY_CARE_PROVIDER_SITE_OTHER): Payer: MEDICAID | Admitting: Family Medicine

## 2023-08-27 DIAGNOSIS — Z91199 Patient's noncompliance with other medical treatment and regimen due to unspecified reason: Secondary | ICD-10-CM

## 2023-08-27 NOTE — Progress Notes (Signed)
Patient was not seen for appt d/t no call, no show, or late arrival >10 mins past appt time.   Elise T Payne, FNP  [facility] [address] [place], [place] [address] [phone] (phone) [phone] (fax) [facility]  

## 2023-09-05 ENCOUNTER — Encounter: Payer: Self-pay | Admitting: Family Medicine

## 2023-09-05 ENCOUNTER — Ambulatory Visit (INDEPENDENT_AMBULATORY_CARE_PROVIDER_SITE_OTHER): Payer: MEDICAID | Admitting: Family Medicine

## 2023-09-05 DIAGNOSIS — Z91199 Patient's noncompliance with other medical treatment and regimen due to unspecified reason: Secondary | ICD-10-CM

## 2023-09-05 NOTE — Addendum Note (Signed)
Addended by: Merita Norton T on: 09/05/2023 01:33 PM   Modules accepted: Level of Service

## 2023-09-05 NOTE — Progress Notes (Signed)
Patient was not seen for appt d/t no call, no show, or late arrival >10 mins past appt time.   Elise T Payne, FNP  [facility] [address] [place], [place] [address] [phone] (phone) [phone] (fax) [facility]  

## 2023-09-09 ENCOUNTER — Ambulatory Visit: Payer: MEDICAID | Admitting: Family Medicine

## 2023-09-09 ENCOUNTER — Other Ambulatory Visit (HOSPITAL_COMMUNITY)
Admission: RE | Admit: 2023-09-09 | Discharge: 2023-09-09 | Disposition: A | Discharge status: Home / Self Care | Payer: MEDICAID | Source: Ambulatory Visit | Attending: Family Medicine | Admitting: Family Medicine

## 2023-09-09 ENCOUNTER — Encounter: Payer: Self-pay | Admitting: Family Medicine

## 2023-09-09 VITALS — BP 101/57 | HR 49 | Temp 97.7°F | Resp 16 | Ht 60.0 in | Wt 125.0 lb

## 2023-09-09 DIAGNOSIS — N760 Acute vaginitis: Secondary | ICD-10-CM | POA: Diagnosis not present

## 2023-09-09 DIAGNOSIS — Z124 Encounter for screening for malignant neoplasm of cervix: Secondary | ICD-10-CM | POA: Insufficient documentation

## 2023-09-09 DIAGNOSIS — N898 Other specified noninflammatory disorders of vagina: Secondary | ICD-10-CM | POA: Diagnosis present

## 2023-09-09 DIAGNOSIS — Z3009 Encounter for other general counseling and advice on contraception: Secondary | ICD-10-CM | POA: Insufficient documentation

## 2023-09-09 NOTE — Assessment & Plan Note (Signed)
Recurrent vaginal infections due to BV and STIs - tested today.  - Referral to GYN to further eval given recurrence. - STI protection educated and stressed.

## 2023-09-09 NOTE — Assessment & Plan Note (Signed)
PAP performed.  Will communicate results.

## 2023-09-09 NOTE — Assessment & Plan Note (Signed)
Pt not using any form of birth control. Tracking cycle. Educated and discussed potential options. Pt decline further information does not want any medication at this time.  Discussed use of non-latex condoms as pt does not want to get pregnant.

## 2023-09-09 NOTE — Assessment & Plan Note (Signed)
Vaginal swab for BV, yeast, and will test for STIs. Based on results will treat.  Stressed STI protection- condoms, abstinence.  No douching, no soaps, no scented soaps or lotions, Cotton underwear. Keep area dry after toileting and showering.

## 2023-09-09 NOTE — Progress Notes (Signed)
Acute Office Visit  Introduced to nurse practitioner role and practice setting.  All questions answered.  Discussed provider/patient relationship and expectations.   Subjective:     Patient ID: Tiffany Bush, female    DOB: 09/27/2000, 22 y.o.   MRN: [id number]  Chief Complaint  Patient presents with   Follow-up   Abnormal Pap Smear    4wk f/u and sti testing Gets BV quite frequently and didn't take the prescription the last time    Pt presents for PAP and has concerns for recurrent bacterial vaginosis infections.   States has clear to white discharge, odor is sometimes fishy. Denies lesions, bleeding, dryness. She sometimes has itchy feeling and can can painful sex. No history of pregnancies, hx of treated chlamydia and gonorrhea.   LMP 12/3 - 12/7  normal bleeding and cramping manageable. Comes every 28-30 days. Intermittent bartholin cysts - not present today.    Review of Systems  Genitourinary:  Negative for dysuria, flank pain, frequency, hematuria and urgency.  All other systems reviewed and are negative.     Objective:    BP (!) 101/57 (BP Location: Right Arm, Patient Position: Sitting, Cuff Size: Normal)   Pulse (!) 49   Temp 97.7 F (36.5 C)   Resp 16   Ht 5' (1.524 m)   Wt 125 lb (56.7 kg)   BMI 24.41 kg/m    Physical Exam Exam conducted with a chaperone present.  Constitutional:      Appearance: Normal appearance. She is normal weight.  HENT:     Head: Normocephalic.     Nose: Nose normal.  Eyes:     Extraocular Movements: Extraocular movements intact.     Pupils: Pupils are equal, round, and reactive to light.  Abdominal:     Hernia: There is no hernia in the left inguinal area or right inguinal area.  Genitourinary:    General: Normal vulva.     Exam position: Lithotomy position.     Pubic Area: No rash.      Tanner stage (genital): 5.     Labia:        Right: No rash, tenderness, lesion or injury.        Left: No rash, tenderness, lesion  or injury.      Urethra: No prolapse, urethral pain or urethral swelling.     Vagina: Vaginal discharge present. No erythema, lesions or prolapsed vaginal walls.     Cervix: Discharge and erythema present. No cervical motion tenderness.     Uterus: Normal.      Adnexa: Right adnexa normal and left adnexa normal.     Rectum: Normal.     Comments: Mild pressure tenderness to uterine palpitation. Discharge white-green creamy, thick consistency. Neurological:     Mental Status: She is alert.     No results found for any visits on 09/09/23.      Assessment & Plan:   Problem List Items Addressed This Visit       Genitourinary   Recurrent vaginitis   Recurrent vaginal infections due to BV and STIs - tested today.  - Referral to GYN to further eval given recurrence. - STI protection educated and stressed.         Other   Pap smear for cervical cancer screening - Primary   PAP performed.  Will communicate results.       Relevant Orders   Cytology - PAP   Vaginal odor   Vaginal swab for BV, yeast, and  will test for STIs. Based on results will treat.  Stressed STI protection- condoms, abstinence.  No douching, no soaps, no scented soaps or lotions, Cotton underwear. Keep area dry after toileting and showering.        Relevant Orders   Cervicovaginal ancillary only   Birth control counseling   Pt not using any form of birth control. Tracking cycle. Educated and discussed potential options. Pt decline further information does not want any medication at this time.  Discussed use of non-latex condoms as pt does not want to get pregnant.      Other Visit Diagnoses       Vaginal discharge       Relevant Orders   Cervicovaginal ancillary only       Will communicate lab results with pt.   No orders of the defined types were placed in this encounter.   Return if symptoms worsen or fail to improve.  I, Sallee Provencal, FNP, have reviewed all documentation for this  visit. The documentation on 09/09/23 for the exam, diagnosis, procedures, and orders are all accurate and complete.   Sallee Provencal, FNP

## 2023-09-10 LAB — CERVICOVAGINAL ANCILLARY ONLY
Bacterial Vaginitis (gardnerella): POSITIVE — AB
Candida Glabrata: NEGATIVE
Candida Vaginitis: NEGATIVE
Comment: NEGATIVE
Comment: NEGATIVE
Comment: NEGATIVE

## 2023-09-12 ENCOUNTER — Other Ambulatory Visit: Payer: Self-pay | Admitting: Family Medicine

## 2023-09-12 DIAGNOSIS — R87613 High grade squamous intraepithelial lesion on cytologic smear of cervix (HGSIL): Secondary | ICD-10-CM

## 2023-09-12 LAB — CYTOLOGY - PAP
Chlamydia: NEGATIVE
Comment: NEGATIVE
Comment: NEGATIVE
Comment: NEGATIVE
Comment: NORMAL
Diagnosis: HIGH — AB
High risk HPV: POSITIVE — AB
Neisseria Gonorrhea: NEGATIVE
Trichomonas: NEGATIVE

## 2023-09-12 MED ORDER — METRONIDAZOLE 500 MG PO TABS: 500.0000 mg | ORAL_TABLET | Freq: Two times a day (BID) | ORAL | 0 refills | Status: DC

## 2023-09-12 NOTE — Progress Notes (Signed)
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  Negative for Gonorrhea, trichomonas, chlamydia.

## 2023-09-13 ENCOUNTER — Telehealth: Payer: Self-pay | Admitting: Family Medicine

## 2023-09-13 ENCOUNTER — Encounter: Payer: Self-pay | Admitting: Family Medicine

## 2023-09-13 NOTE — Telephone Encounter (Signed)
Pt called back regarding her PAP report. Please call pt to discuss as she has questions this NT cannot answer because it is beyond my scope of practive. She is scared. Pt stated she already has a GYN at [facility] and call transferred to that clinic.

## 2023-09-18 NOTE — L&D Delivery Note (Signed)
 Delivery Note At 11:04 AM a viable and healthy female [place] was delivered via Vaginal, Spontaneous (Presentation: Left Occiput Anterior).  APGAR: 8 9, weight 5 lb 11.7 oz (2600 g).   Placenta status: Spontaneous, Intact.  Cord: 3 vessels with the following complications: None.   Anesthesia: Epidural Episiotomy: None Lacerations: Labial Suture Repair: 3.0 vicryl Est. Blood Loss (mL): 150  Mom to postpartum.  Baby to Couplet care / Skin to Skin.  Heather Penton 05/09/2024, 11:21 AM

## 2023-09-23 ENCOUNTER — Other Ambulatory Visit: Payer: Self-pay | Admitting: Family Medicine

## 2023-09-23 DIAGNOSIS — R87613 High grade squamous intraepithelial lesion on cytologic smear of cervix (HGSIL): Secondary | ICD-10-CM

## 2023-09-28 ENCOUNTER — Other Ambulatory Visit: Payer: Self-pay

## 2023-09-28 ENCOUNTER — Emergency Department: Payer: MEDICAID

## 2023-09-28 DIAGNOSIS — Z5321 Procedure and treatment not carried out due to patient leaving prior to being seen by health care provider: Secondary | ICD-10-CM | POA: Insufficient documentation

## 2023-09-28 DIAGNOSIS — O26851 Spotting complicating pregnancy, first trimester: Secondary | ICD-10-CM | POA: Insufficient documentation

## 2023-09-28 DIAGNOSIS — Z3A00 Weeks of gestation of pregnancy not specified: Secondary | ICD-10-CM | POA: Insufficient documentation

## 2023-09-28 LAB — POC URINE PREG, ED: Preg Test, Ur: POSITIVE — AB

## 2023-09-28 LAB — HCG, QUANTITATIVE, PREGNANCY: hCG, Beta Chain, Quant, S: 19518 m[IU]/mL — ABNORMAL HIGH (ref <5)

## 2023-09-28 NOTE — ED Triage Notes (Signed)
 Pt to ED via POV c/o vaginal bleeding. Pt reports had a positive pregnancy test on Thursday. LMP Dec 6th. Has been spotting and having cramps.

## 2023-09-28 NOTE — ED Notes (Signed)
 Pt to [place].

## 2023-09-29 ENCOUNTER — Emergency Department
Admission: EM | Admit: 2023-09-29 | Discharge: 2023-09-29 | Discharge status: Against Medical Advice | Payer: MEDICAID | Attending: Emergency Medicine | Admitting: Emergency Medicine

## 2023-10-02 ENCOUNTER — Encounter: Payer: Self-pay | Admitting: Emergency Medicine

## 2023-10-02 ENCOUNTER — Emergency Department
Admission: EM | Admit: 2023-10-02 | Discharge: 2023-10-02 | Disposition: A | Discharge status: Home / Self Care | Payer: MEDICAID | Attending: Emergency Medicine | Admitting: Emergency Medicine

## 2023-10-02 ENCOUNTER — Other Ambulatory Visit: Payer: Self-pay

## 2023-10-02 DIAGNOSIS — O219 Vomiting of pregnancy, unspecified: Secondary | ICD-10-CM | POA: Diagnosis present

## 2023-10-02 DIAGNOSIS — Z3491 Encounter for supervision of normal pregnancy, unspecified, first trimester: Secondary | ICD-10-CM

## 2023-10-02 DIAGNOSIS — R82998 Other abnormal findings in urine: Secondary | ICD-10-CM | POA: Insufficient documentation

## 2023-10-02 DIAGNOSIS — Z3A01 Less than 8 weeks gestation of pregnancy: Secondary | ICD-10-CM | POA: Diagnosis not present

## 2023-10-02 LAB — CBC WITH DIFFERENTIAL/PLATELET
Abs Immature Granulocytes: 0.01 10*3/uL (ref 0.00–0.07)
Basophils Absolute: 0.1 10*3/uL (ref 0.0–0.1)
Basophils Relative: 1 %
Eosinophils Absolute: 0.1 10*3/uL (ref 0.0–0.5)
Eosinophils Relative: 2 %
HCT: 38.7 % (ref 36.0–46.0)
Hemoglobin: 13.9 g/dL (ref 12.0–15.0)
Immature Granulocytes: 0 %
Lymphocytes Relative: 36 %
Lymphs Abs: 2.2 10*3/uL (ref 0.7–4.0)
MCH: 33.7 pg (ref 26.0–34.0)
MCHC: 35.9 g/dL (ref 30.0–36.0)
MCV: 93.7 fL (ref 80.0–100.0)
Monocytes Absolute: 0.5 10*3/uL (ref 0.1–1.0)
Monocytes Relative: 8 %
Neutro Abs: 3.3 10*3/uL (ref 1.7–7.7)
Neutrophils Relative %: 53 %
Platelets: 200 10*3/uL (ref 150–400)
RBC: 4.13 MIL/uL (ref 3.87–5.11)
RDW: 11.1 % — ABNORMAL LOW (ref 11.5–15.5)
WBC: 6.1 10*3/uL (ref 4.0–10.5)
nRBC: 0 % (ref 0.0–0.2)

## 2023-10-02 LAB — URINALYSIS, ROUTINE W REFLEX MICROSCOPIC
Bilirubin Urine: NEGATIVE
Glucose, UA: NEGATIVE mg/dL
Ketones, ur: NEGATIVE mg/dL
Nitrite: NEGATIVE
Protein, ur: NEGATIVE mg/dL
RBC / HPF: 0 RBC/hpf (ref 0–5)
Specific Gravity, Urine: 1.017 (ref 1.005–1.030)
pH: 7 (ref 5.0–8.0)

## 2023-10-02 LAB — BASIC METABOLIC PANEL
Anion gap: 9 (ref 5–15)
BUN: 6 mg/dL (ref 6–20)
CO2: 23 mmol/L (ref 22–32)
Calcium: 9.2 mg/dL (ref 8.9–10.3)
Chloride: 102 mmol/L (ref 98–111)
Creatinine, Ser: 0.62 mg/dL (ref 0.44–1.00)
GFR, Estimated: >60 mL/min (ref >60)
Glucose, Bld: 96 mg/dL (ref 70–99)
Potassium: 4.5 mmol/L (ref 3.5–5.1)
Sodium: 134 mmol/L — ABNORMAL LOW (ref 135–145)

## 2023-10-02 LAB — HCG, QUANTITATIVE, PREGNANCY: hCG, Beta Chain, Quant, S: 48617 m[IU]/mL — ABNORMAL HIGH (ref <5)

## 2023-10-02 LAB — POC URINE PREG, ED: Preg Test, Ur: POSITIVE — AB

## 2023-10-02 MED ORDER — CEPHALEXIN 500 MG PO CAPS: 500.0000 mg | ORAL_CAPSULE | Freq: Three times a day (TID) | ORAL | 0 refills | Status: AC

## 2023-10-02 MED ORDER — ONDANSETRON 4 MG PO TBDP
4.0000 mg | ORAL_TABLET | Freq: Once | ORAL | Status: AC
  Administered 2023-10-02: 4 mg via ORAL
  Filled 2023-10-02: qty 1

## 2023-10-02 MED ORDER — ONDANSETRON 4 MG PO TBDP: 4.0000 mg | ORAL_TABLET | Freq: Three times a day (TID) | ORAL | 0 refills | Status: DC | PRN

## 2023-10-02 NOTE — Discharge Instructions (Addendum)
 Your exam and labs are normal and reassuring. You have evidence of a developing pregnancy, in the uterus, as expected. You should have your labs and US  repeated in 2 weeks to confirm ongoing development. Take the previously prescribed Metronidazole  and the nausea medicine as needed. Follow your urine culture results for indication to take the new antibiotic. Follow-up with your [facility] provider as scheduled. Return to the ED as needed.

## 2023-10-02 NOTE — ED Triage Notes (Signed)
 Patient ambulatory to triage with complaints of nausea/vomiting, states she thinks she is pregnant but unsure how far along. LMP 12/3. Endorses some light spotting since that started 2 weeks ago on and off, no cramps.

## 2023-10-02 NOTE — ED Provider Triage Note (Signed)
 Emergency Medicine Provider Triage Evaluation Note  Tiffany Bush , a 23 y.o. female G1P0  was evaluated in triage.  Pt complains of n/v in pregnancy. Thinks she is [redacted] weeks pregnant. Came in over the weekend for spotting, this has improved. Rh+. No discharge or pain.  Review of Systems  Positive: N/v Negative: Abd pain  Physical Exam  BP (!) 131/97 (BP Location: Left Arm)   Pulse (!) 53   Temp 97.6 F (36.4 C) (Oral)   Resp 16   Ht 5' (1.524 m)   Wt 56.7 kg   SpO2 100%   BMI 24.41 kg/m  Gen:   Awake, no distress   Resp:  Normal effort  MSK:   Moves extremities without difficulty  Other:    Medical Decision Making  Medically screening exam initiated at 5:30 PM.  Appropriate orders placed.  ANDREANA SIGUR was informed that the remainder of the evaluation will be completed by another provider, this initial triage assessment does not replace that evaluation, and the importance of remaining in the ED until their evaluation is complete.     Daquane Aguilar E, PA-C 10/02/23 1732

## 2023-10-02 NOTE — ED Provider Notes (Signed)
 [facility] Emergency Department Provider Note     Event Date/Time   First MD Initiated Contact with Patient 10/02/23 1957     (approximate)   History   Emesis During Pregnancy   HPI  Tiffany Bush is a 23 y.o. female G1P1 presents to the ED for evaluation of ongoing intermittent nausea and vomiting.  Patient ED 3 days prior for evaluation of a positive home pregnancy test.  She left prior to evaluation secondary to the protracted wait.  She returns to the ED for evaluation having noted her ultrasound results on the patient portal.  She reports her LMP is 12/3.  She reports some pink to dark brown spotting when she wipes.  She denies any active or ongoing heavy vaginal bleeding.  She also reports she had been diagnosed in the recent past with BV, but did not complete the course of medications when she found out she was pregnant.  She is unclear whether the medication is safe for use in pregnancy.  Patient does intend to complete prenatal care at [facility].  Physical Exam   Triage Vital Signs: ED Triage Vitals  Encounter Vitals Group     BP 10/02/23 1728 (!) 131/97     Systolic BP Percentile --      Diastolic BP Percentile --      Pulse Rate 10/02/23 1728 (!) 53     Resp 10/02/23 1728 16     Temp 10/02/23 1728 97.6 F (36.4 C)     Temp Source 10/02/23 1728 Oral     SpO2 10/02/23 1728 100 %     Weight 10/02/23 1729 125 lb (56.7 kg)     Height 10/02/23 1729 5' (1.524 m)     Head Circumference --      Peak Flow --      Pain Score 10/02/23 1739 0     Pain Loc --      Pain Education --      Exclude from Growth Chart --     Most recent vital signs: Vitals:   10/02/23 1728 10/02/23 2059  BP: (!) 131/97 (!) 108/52  Pulse: (!) 53 (!) 51  Resp: 16 15  Temp: 97.6 F (36.4 C)   SpO2: 100% 98%    General Awake, no distress. NAD HEENT NCAT. PERRL. EOMI. No rhinorrhea. Mucous membranes are moist.  CV:  Good peripheral perfusion.   RESP:  Normal effort.  ABD:  No distention.  GU:  Deferred    ED Results / Procedures / Treatments   Labs (all labs ordered are listed, but only abnormal results are displayed) Labs Reviewed  URINE CULTURE - Abnormal; Notable for the following components:      Result Value   Culture MULTIPLE SPECIES PRESENT, SUGGEST RECOLLECTION (*)    All other components within normal limits  BASIC METABOLIC PANEL - Abnormal; Notable for the following components:   Sodium 134 (*)    All other components within normal limits  CBC WITH DIFFERENTIAL/PLATELET - Abnormal; Notable for the following components:   RDW 11.1 (*)    All other components within normal limits  HCG, QUANTITATIVE, PREGNANCY - Abnormal; Notable for the following components:   hCG, Beta Chain, Quant, S 16,109 (*)    All other components within normal limits  URINALYSIS, ROUTINE W REFLEX MICROSCOPIC - Abnormal; Notable for the following components:   Color, Urine YELLOW (*)    APPearance CLOUDY (*)    Hgb urine dipstick  SMALL (*)    Leukocytes,Ua LARGE (*)    Bacteria, UA RARE (*)    All other components within normal limits  POC URINE PREG, ED - Abnormal; Notable for the following components:   Preg Test, Ur POSITIVE (*)    All other components within normal limits     EKG   RADIOLOGY  US  OB <14 Weeks (09/28/23)  IMPRESSION: Single intrauterine gestational sac, measuring 6 weeks 0 days, without yolk sac or fetal pole. Consider follow-up pelvic ultrasound in 10-14 days to confirm viability, as clinically warranted.  PROCEDURES:  Critical Care performed: No  Procedures   MEDICATIONS ORDERED IN ED: Medications  ondansetron  (ZOFRAN -ODT) disintegrating tablet 4 mg (4 mg Oral Given 10/02/23 2058)     IMPRESSION / MDM / ASSESSMENT AND PLAN / ED COURSE  I reviewed the triage vital signs and the nursing notes.                              Differential diagnosis includes, but is not limited to, threatened  miscarriage, incomplete miscarriage, normal bleeding from an early trimester pregnancy, ectopic pregnancy, , blighted ovum, vaginal/cervical trauma, subchorionic hemorrhage/hematoma, etc.   Patient's presentation is most consistent with acute complicated illness / injury requiring diagnostic workup.  Patient's diagnosis is consistent with first trimester pregnancy.  Patient with ultrasound evidence of a single intrauterine gestational sac measuring 6 weeks without evidence of a yolk sac or fetal pole.  This likely indicates a early pregnancy but unclear viability at this time.  Patient is advised that she should follow-up with her intended OB and have a repeat ultrasound and beta quant level checked in 2 weeks time. Patient will be discharged home with prescriptions for Zofran  and Keflex .  Urine culture is pending at this time.  Patient is to follow up with [facility] OB as planned, as needed or otherwise directed. Patient is given ED precautions to return to the ED for any worsening or new symptoms.  FINAL CLINICAL IMPRESSION(S) / ED DIAGNOSES   Final diagnoses:  First trimester pregnancy     Rx / DC Orders   ED Discharge Orders          Ordered    ondansetron  (ZOFRAN -ODT) 4 MG disintegrating tablet  Every 8 hours PRN        10/02/23 2050    cephALEXin  (KEFLEX ) 500 MG capsule  3 times daily        10/02/23 2050             Note:  This document was prepared using Dragon voice recognition software and may include unintentional dictation errors.    May Sparks, PA-C 10/06/23 2306    Ruth Cove, MD 10/07/23 1420

## 2023-10-04 LAB — URINE CULTURE: Special Requests: NORMAL

## 2023-10-10 ENCOUNTER — Other Ambulatory Visit: Payer: Self-pay

## 2023-10-10 ENCOUNTER — Emergency Department: Payer: MEDICAID

## 2023-10-10 ENCOUNTER — Encounter: Payer: Self-pay | Admitting: Emergency Medicine

## 2023-10-10 ENCOUNTER — Emergency Department
Admission: EM | Admit: 2023-10-10 | Discharge: 2023-10-10 | Disposition: A | Discharge status: Home / Self Care | Payer: MEDICAID | Attending: Emergency Medicine | Admitting: Emergency Medicine

## 2023-10-10 DIAGNOSIS — O210 Mild hyperemesis gravidarum: Secondary | ICD-10-CM | POA: Insufficient documentation

## 2023-10-10 DIAGNOSIS — Z3A01 Less than 8 weeks gestation of pregnancy: Secondary | ICD-10-CM | POA: Diagnosis not present

## 2023-10-10 DIAGNOSIS — Z91040 Latex allergy status: Secondary | ICD-10-CM | POA: Diagnosis not present

## 2023-10-10 LAB — CBC
HCT: 37.4 % (ref 36.0–46.0)
Hemoglobin: 13.7 g/dL (ref 12.0–15.0)
MCH: 34.2 pg — ABNORMAL HIGH (ref 26.0–34.0)
MCHC: 36.6 g/dL — ABNORMAL HIGH (ref 30.0–36.0)
MCV: 93.3 fL (ref 80.0–100.0)
Platelets: 202 10*3/uL (ref 150–400)
RBC: 4.01 MIL/uL (ref 3.87–5.11)
RDW: 11.2 % — ABNORMAL LOW (ref 11.5–15.5)
WBC: 8.2 10*3/uL (ref 4.0–10.5)
nRBC: 0 % (ref 0.0–0.2)

## 2023-10-10 LAB — COMPREHENSIVE METABOLIC PANEL
ALT: 13 U/L (ref 0–44)
AST: 18 U/L (ref 15–41)
Albumin: 4.3 g/dL (ref 3.5–5.0)
Alkaline Phosphatase: 36 U/L — ABNORMAL LOW (ref 38–126)
Anion gap: 9 (ref 5–15)
BUN: 8 mg/dL (ref 6–20)
CO2: 23 mmol/L (ref 22–32)
Calcium: 9.3 mg/dL (ref 8.9–10.3)
Chloride: 107 mmol/L (ref 98–111)
Creatinine, Ser: 0.61 mg/dL (ref 0.44–1.00)
GFR, Estimated: >60 mL/min (ref >60)
Glucose, Bld: 91 mg/dL (ref 70–99)
Potassium: 4.1 mmol/L (ref 3.5–5.1)
Sodium: 139 mmol/L (ref 135–145)
Total Bilirubin: 1.1 mg/dL (ref 0.0–1.2)
Total Protein: 7.1 g/dL (ref 6.5–8.1)

## 2023-10-10 LAB — URINALYSIS, ROUTINE W REFLEX MICROSCOPIC
Bilirubin Urine: NEGATIVE
Glucose, UA: NEGATIVE mg/dL
Ketones, ur: 80 mg/dL — AB
Nitrite: NEGATIVE
Protein, ur: 30 mg/dL — AB
Specific Gravity, Urine: 1.021 (ref 1.005–1.030)
pH: 7 (ref 5.0–8.0)

## 2023-10-10 LAB — HCG, QUANTITATIVE, PREGNANCY: hCG, Beta Chain, Quant, S: 123418 m[IU]/mL — ABNORMAL HIGH (ref <5)

## 2023-10-10 LAB — LIPASE, BLOOD: Lipase: 26 U/L (ref 11–51)

## 2023-10-10 MED ORDER — SODIUM CHLORIDE 0.9 % IV BOLUS
1000.0000 mL | Freq: Once | INTRAVENOUS | Status: AC
  Administered 2023-10-10: 1000 mL via INTRAVENOUS

## 2023-10-10 MED ORDER — METOCLOPRAMIDE HCL 5 MG/ML IJ SOLN
10.0000 mg | Freq: Once | INTRAMUSCULAR | Status: AC
  Administered 2023-10-10: 10 mg via INTRAVENOUS
  Filled 2023-10-10: qty 2

## 2023-10-10 MED ORDER — METOCLOPRAMIDE HCL 10 MG PO TABS: 10.0000 mg | ORAL_TABLET | Freq: Three times a day (TID) | ORAL | 0 refills | Status: DC | PRN

## 2023-10-10 NOTE — ED Provider Notes (Signed)
Tiffany Bush   CSN: [id number] Arrival date & time: 10/10/23  1437     History  Chief Complaint  Patient presents with   Emesis    Tiffany Bush is a 23 y.o. female.  Patient is a G1, P0 here for nausea and vomiting.  She is [redacted] weeks pregnant and unable to keep anything down.  She has some abdominal pain mostly just with vomiting.  Denies any vaginal pain or bleeding.  No fevers or chills.  Denies any injury.  Followed by Mariea Stable.   Emesis Associated symptoms: abdominal pain   Associated symptoms: no chills and no fever        Home Medications Prior to Admission medications   Medication Sig Start Date End Date Taking? Authorizing Provider  metoCLOPramide (REGLAN) 10 MG tablet Take 1 tablet (10 mg total) by mouth every 8 (eight) hours as needed for up to 7 days for nausea or vomiting. 10/10/23 10/17/23 Yes Christen Bame, PA-C  hydrOXYzine (VISTARIL) 25 MG capsule Take 1 capsule (25 mg total) by mouth every 8 (eight) hours as needed for anxiety. Patient not taking: Reported on 09/09/2023 05/24/23   Jacky Kindle, FNP  metroNIDAZOLE (FLAGYL) 500 MG tablet Take 1 tablet (500 mg total) by mouth 2 (two) times daily. 09/12/23   Sallee Provencal, FNP  ondansetron (ZOFRAN-ODT) 4 MG disintegrating tablet Take 1 tablet (4 mg total) by mouth every 8 (eight) hours as needed for nausea or vomiting. 10/02/23   Menshew, Charlesetta Ivory, PA-C  promethazine (PHENERGAN) 12.5 MG tablet Take 1 tablet (12.5 mg total) by mouth every 6 (six) hours as needed for nausea or vomiting. Patient not taking: Reported on 09/09/2023 04/08/23   Chesley Noon, MD  traZODone (DESYREL) 50 MG tablet Take 1 tablet (50 mg total) by mouth at bedtime as needed for sleep. Patient not taking: Reported on 09/09/2023 06/17/23   Jacky Kindle, FNP      Allergies    Zoloft Nolene Bernheim hcl], Doxycycline, and Latex    Review of Systems   Review of Systems   Constitutional:  Negative for chills and fever.  Respiratory:  Negative for shortness of breath.   Cardiovascular:  Negative for chest pain.  Gastrointestinal:  Positive for abdominal pain, nausea and vomiting.  Genitourinary:  Negative for flank pain.  Musculoskeletal:  Negative for back pain.  Skin:  Negative for wound.  Neurological:  Negative for weakness and numbness.    Physical Exam Updated Vital Signs BP 122/60 (BP Location: Left Arm)   Pulse (!) 105   Temp 98.5 F (36.9 C) (Oral)   Resp 18   Ht 5' (1.524 m)   Wt 54.4 kg   LMP 08/20/2023 (Exact Date)   SpO2 99%   BMI 23.44 kg/m  Physical Exam Vitals and nursing Bush reviewed.  Constitutional:      General: She is not in acute distress.    Appearance: She is well-developed.  HENT:     Head: Normocephalic and atraumatic.  Eyes:     Conjunctiva/sclera: Conjunctivae normal.  Cardiovascular:     Rate and Rhythm: Regular rhythm. Tachycardia present.     Heart sounds: No murmur heard. Pulmonary:     Effort: Pulmonary effort is normal. No respiratory distress.     Breath sounds: Normal breath sounds.  Abdominal:     Palpations: Abdomen is soft.  Musculoskeletal:        General: No swelling.  Cervical back: Neck supple.  Skin:    General: Skin is warm and dry.     Capillary Refill: Capillary refill takes less than 2 seconds.  Neurological:     Mental Status: She is alert.  Psychiatric:        Mood and Affect: Mood normal.     ED Results / Procedures / Treatments   Labs (all labs ordered are listed, but only abnormal results are displayed) Labs Reviewed  COMPREHENSIVE METABOLIC PANEL - Abnormal; Notable for the following components:      Result Value   Alkaline Phosphatase 36 (*)    All other components within normal limits  CBC - Abnormal; Notable for the following components:   MCH 34.2 (*)    MCHC 36.6 (*)    RDW 11.2 (*)    All other components within normal limits  URINALYSIS, ROUTINE W REFLEX  MICROSCOPIC - Abnormal; Notable for the following components:   Color, Urine AMBER (*)    APPearance CLOUDY (*)    Hgb urine dipstick SMALL (*)    Ketones, ur 80 (*)    Protein, ur 30 (*)    Leukocytes,Ua MODERATE (*)    Bacteria, UA MANY (*)    All other components within normal limits  HCG, QUANTITATIVE, PREGNANCY - Abnormal; Notable for the following components:   hCG, Beta Chain, Quant, S 123,418 (*)    All other components within normal limits  URINE CULTURE  LIPASE, BLOOD    EKG None  Radiology US OB Comp Less 14 Wks Result Date: 10/10/2023 CLINICAL DATA:  Pregnant with abdominal pain EXAM: OBSTETRIC <14 WK ULTRASOUND TECHNIQUE: Transabdominal ultrasound was performed for evaluation of the gestation as well as the maternal uterus and adnexal regions. COMPARISON:  09/28/2023 FINDINGS: Intrauterine gestational sac: Single intrauterine gestational sac Yolk sac:  Visualized Embryo:  Visualized Cardiac Activity: Visualized Heart Rate: 155 bpm CRL:   11 mm   7 w 2 d                  [place] EDC: 05/26/2024 Subchorionic hemorrhage:  None visualized. Maternal uterus/adnexae: Ovaries are within normal limits. Left ovary measures 3.9 x 2.3 x 2.8 cm. The right ovary measures 3.2 x 2.5 x 3.1 cm. No significant free fluid IMPRESSION: Single viable intrauterine pregnancy as above. No specific abnormality is seen Electronically Signed   By: Jasmine Pang M.D.   On: 10/10/2023 17:53    Procedures Procedures    Medications Ordered in ED Medications  sodium chloride 0.9 % bolus 1,000 mL (1,000 mLs Intravenous New Bag/Given 10/10/23 1753)  metoCLOPramide (REGLAN) injection 10 mg (10 mg Intravenous Given 10/10/23 1754)    ED Course/ Medical Decision Making/ A&P                                 Medical Decision Making Patient here [redacted] weeks pregnant for nausea vomiting.  Does have abdominal pain but no tenderness on exam.  She is tachycardic but otherwise well-appearing on my exam.  Her labs are  overall reassuring.  She does have appropriate elevated hCG.  No acute metabolic abnormalities.  I will give fluids and recheck OB ultrasound.  Ultrasound is reassuring.  Unremarkable IUP.  I did review results with patient and family.  They were able to see the ultrasound.  Patient did had some improvement with Reglan.  Tolerating p.o. Urinalysis is without convincing evidence of acute urinary tract  infection.  Will send off culture.  Amount and/or Complexity of Data Reviewed Labs: ordered. Radiology: ordered.  Risk Prescription drug management.           Final Clinical Impression(s) / ED Diagnoses Final diagnoses:  Hyperemesis gravidarum    Rx / DC Orders ED Discharge Orders          Ordered    metoCLOPramide (REGLAN) 10 MG tablet  Every 8 hours PRN        10/10/23 1916              Christen Bame, Cordelia Poche 10/10/23 1918    Dionne Bucy, MD 10/10/23 [phone]

## 2023-10-10 NOTE — ED Triage Notes (Signed)
Pt ambulatory to triage.  Pt is approx [redacted] weeks pregnant. Pt has vomiting and abd pain.  Sx for 2 weeks.   No vag bleeding.  Pt alert  speech clear.

## 2023-10-10 NOTE — ED Notes (Signed)
Taking sips of water  Tolerated well    States her abd discomfort was from vomiting  No vaginal bleeding

## 2023-10-10 NOTE — ED Notes (Signed)
See triage note  Presents with some n/v  States she is pregnant  and is not able to keep anything down

## 2023-10-10 NOTE — Discharge Instructions (Signed)
Please give plenty of fluids mostly water.  Take nausea medicine as needed.  Follow-up with OB/GYN in the next few days for recheck.  Return here for new or worse symptoms.

## 2023-10-11 LAB — URINE CULTURE
Culture: <10000 — AB
Special Requests: NORMAL

## 2023-10-20 DIAGNOSIS — O99511 Diseases of the respiratory system complicating pregnancy, first trimester: Secondary | ICD-10-CM | POA: Diagnosis not present

## 2023-10-20 DIAGNOSIS — Z3A01 Less than 8 weeks gestation of pregnancy: Secondary | ICD-10-CM | POA: Diagnosis not present

## 2023-10-20 DIAGNOSIS — O23591 Infection of other part of genital tract in pregnancy, first trimester: Secondary | ICD-10-CM | POA: Diagnosis not present

## 2023-10-20 DIAGNOSIS — B9689 Other specified bacterial agents as the cause of diseases classified elsewhere: Secondary | ICD-10-CM | POA: Insufficient documentation

## 2023-10-20 DIAGNOSIS — O209 Hemorrhage in early pregnancy, unspecified: Secondary | ICD-10-CM | POA: Diagnosis present

## 2023-10-20 DIAGNOSIS — J45909 Unspecified asthma, uncomplicated: Secondary | ICD-10-CM | POA: Diagnosis not present

## 2023-10-21 ENCOUNTER — Emergency Department
Admission: EM | Admit: 2023-10-21 | Discharge: 2023-10-21 | Disposition: A | Discharge status: Home / Self Care | Payer: MEDICAID | Attending: Emergency Medicine | Admitting: Emergency Medicine

## 2023-10-21 ENCOUNTER — Emergency Department: Payer: MEDICAID

## 2023-10-21 DIAGNOSIS — O4690 Antepartum hemorrhage, unspecified, unspecified trimester: Secondary | ICD-10-CM

## 2023-10-21 DIAGNOSIS — B9689 Other specified bacterial agents as the cause of diseases classified elsewhere: Secondary | ICD-10-CM

## 2023-10-21 LAB — CHLAMYDIA/NGC RT PCR (ARMC ONLY)
Chlamydia Tr: NOT DETECTED
N gonorrhoeae: NOT DETECTED

## 2023-10-21 LAB — URINALYSIS, ROUTINE W REFLEX MICROSCOPIC
Bilirubin Urine: NEGATIVE
Glucose, UA: NEGATIVE mg/dL
Ketones, ur: NEGATIVE mg/dL
Nitrite: NEGATIVE
Protein, ur: NEGATIVE mg/dL
Specific Gravity, Urine: 1.014 (ref 1.005–1.030)
pH: 5 (ref 5.0–8.0)

## 2023-10-21 LAB — ABO/RH: ABO/RH(D): O POS

## 2023-10-21 LAB — CBC
HCT: 33.8 % — ABNORMAL LOW (ref 36.0–46.0)
Hemoglobin: 12.5 g/dL (ref 12.0–15.0)
MCH: 33.8 pg (ref 26.0–34.0)
MCHC: 37 g/dL — ABNORMAL HIGH (ref 30.0–36.0)
MCV: 91.4 fL (ref 80.0–100.0)
Platelets: 224 10*3/uL (ref 150–400)
RBC: 3.7 MIL/uL — ABNORMAL LOW (ref 3.87–5.11)
RDW: 11 % — ABNORMAL LOW (ref 11.5–15.5)
WBC: 10.6 10*3/uL — ABNORMAL HIGH (ref 4.0–10.5)
nRBC: 0 % (ref 0.0–0.2)

## 2023-10-21 LAB — POC URINE PREG, ED: Preg Test, Ur: POSITIVE — AB

## 2023-10-21 LAB — WET PREP, GENITAL
Sperm: NONE SEEN
Trich, Wet Prep: NONE SEEN
WBC, Wet Prep HPF POC: >10 — AB (ref <10)
Yeast Wet Prep HPF POC: NONE SEEN

## 2023-10-21 LAB — HCG, QUANTITATIVE, PREGNANCY: hCG, Beta Chain, Quant, S: 193009 m[IU]/mL — ABNORMAL HIGH (ref <5)

## 2023-10-21 MED ORDER — METRONIDAZOLE 500 MG PO TABS
500.0000 mg | ORAL_TABLET | Freq: Once | ORAL | Status: AC
  Administered 2023-10-21: 500 mg via ORAL
  Filled 2023-10-21: qty 1

## 2023-10-21 MED ORDER — METRONIDAZOLE 500 MG PO TABS: 500.0000 mg | ORAL_TABLET | Freq: Two times a day (BID) | ORAL | 0 refills | Status: DC

## 2023-10-21 MED ORDER — ACETAMINOPHEN 500 MG PO TABS
1000.0000 mg | ORAL_TABLET | Freq: Once | ORAL | Status: AC
  Administered 2023-10-21: 1000 mg via ORAL
  Filled 2023-10-21: qty 2

## 2023-10-21 MED ORDER — ONDANSETRON HCL 4 MG/2ML IJ SOLN
4.0000 mg | Freq: Once | INTRAMUSCULAR | Status: AC
  Administered 2023-10-21: 4 mg via INTRAVENOUS
  Filled 2023-10-21: qty 2

## 2023-10-21 MED ORDER — ONDANSETRON 4 MG PO TBDP
4.0000 mg | ORAL_TABLET | Freq: Four times a day (QID) | ORAL | 0 refills | Status: DC | PRN
Start: 2023-10-21 — End: 2024-05-09

## 2023-10-21 MED ORDER — SODIUM CHLORIDE 0.9 % IV BOLUS (SEPSIS)
1000.0000 mL | Freq: Once | INTRAVENOUS | Status: AC
  Administered 2023-10-21: 1000 mL via INTRAVENOUS

## 2023-10-21 NOTE — Discharge Instructions (Signed)
You may take over-the-counter Tylenol 1000 mg every 6 hours as needed for pain.  Please increase your fluid intake.  Please follow-up with your OB/GYN as scheduled.

## 2023-10-21 NOTE — ED Triage Notes (Signed)
Patient C/O vaginal spotting that began tonight. Patient has urinated twice this evening and had some pink and brown discharge on the tissue. Denies any heaving bleeding or cramping but is having some lower back pain. Patient is around [redacted] weeks pregnant.

## 2023-10-21 NOTE — ED Provider Notes (Addendum)
[facility] Provider Note    Event Date/Time   First MD Initiated Contact with Patient 10/21/23 [phone]     (approximate)   History   Vaginal Bleeding   HPI  Tiffany Bush is a 23 y.o. female G1, P0 who is approximately 8 weeks, 6 days pregnant based on LMP of 08/20/2023 who presents to the emergency department with vaginal bleeding.  Describes it as spots of dark red and brown.  She also reports she has had an odor and is concerned she may have bacterial vaginosis.  No dysuria.  Having some lower abdominal cramping.  She is followed by [facility] clinic and has had an ultrasound confirming an intrauterine pregnancy.   History provided by patient, significant other.    Past Medical History:  Diagnosis Date   Anxiety    no meds    Asthma    well controlled-exercise induced   Complication of anesthesia    Family history of adverse reaction to anesthesia    mom-n/v   Headache    more frequent during the summer with her allergies   Kidney stones    PONV (postoperative nausea and vomiting)    nausea    Past Surgical History:  Procedure Laterality Date   CYSTOSCOPY     LAPAROSCOPY N/A 04/14/2018   Procedure: LAPAROSCOPY DIAGNOSTIC;  Surgeon: Suzy Bouchard, MD;  Location: [facility] ORS;  Service: Gynecology;  Laterality: N/A;   LAPAROSCOPY N/A 04/14/2018   Procedure: LAPAROSCOPY OPERATIVE;  Surgeon: Schermerhorn, Ihor Austin, MD;  Location: [facility] ORS;  Service: Gynecology;  Laterality: N/A;   TONSILLECTOMY     age 38    MEDICATIONS:  Prior to Admission medications   Medication Sig Start Date End Date Taking? Authorizing Provider  hydrOXYzine (VISTARIL) 25 MG capsule Take 1 capsule (25 mg total) by mouth every 8 (eight) hours as needed for anxiety. Patient not taking: Reported on 09/09/2023 05/24/23   Jacky Kindle, FNP  metoCLOPramide (REGLAN) 10 MG tablet Take 1 tablet (10 mg total) by mouth every 8 (eight) hours as needed for up to 7 days for nausea  or vomiting. 10/10/23 10/17/23  Christen Bame, PA-C  metroNIDAZOLE (FLAGYL) 500 MG tablet Take 1 tablet (500 mg total) by mouth 2 (two) times daily. 09/12/23   Sallee Provencal, FNP  ondansetron (ZOFRAN-ODT) 4 MG disintegrating tablet Take 1 tablet (4 mg total) by mouth every 8 (eight) hours as needed for nausea or vomiting. 10/02/23   Menshew, Charlesetta Ivory, PA-C  promethazine (PHENERGAN) 12.5 MG tablet Take 1 tablet (12.5 mg total) by mouth every 6 (six) hours as needed for nausea or vomiting. Patient not taking: Reported on 09/09/2023 04/08/23   Chesley Noon, MD  traZODone (DESYREL) 50 MG tablet Take 1 tablet (50 mg total) by mouth at bedtime as needed for sleep. Patient not taking: Reported on 09/09/2023 06/17/23   Jacky Kindle, FNP    Physical Exam   Triage Vital Signs: ED Triage Vitals  Encounter Vitals Group     BP 10/21/23 0025 111/68     Systolic BP Percentile --      Diastolic BP Percentile --      Pulse Rate 10/21/23 0025 68     Resp 10/21/23 0025 16     Temp 10/21/23 0025 98.5 F (36.9 C)     Temp Source 10/21/23 0025 Oral     SpO2 10/21/23 0025 100 %     Weight 10/21/23 0024 120 lb (54.4 kg)  Height 10/21/23 0024 5' (1.524 m)     Head Circumference --      Peak Flow --      Pain Score --      Pain Loc --      Pain Education --      Exclude from Growth Chart --     Most recent vital signs: Vitals:   10/21/23 0025  BP: 111/68  Pulse: 68  Resp: 16  Temp: 98.5 F (36.9 C)  SpO2: 100%    CONSTITUTIONAL: Alert, responds appropriately to questions. Well-appearing; well-nourished HEAD: Normocephalic, atraumatic EYES: Conjunctivae clear, pupils appear equal, sclera nonicteric ENT: normal nose; moist mucous membranes NECK: Supple, normal ROM CARD: RRR; S1 and S2 appreciated RESP: Normal chest excursion without splinting or tachypnea; breath sounds clear and equal bilaterally; no wheezes, no rhonchi, no rales, no hypoxia or respiratory distress, speaking full  sentences ABD/GI: Non-distended; soft, non-tender, no rebound, no guarding, no peritoneal signs, no tenderness at McBurney's point BACK: The back appears normal EXT: Normal ROM in all joints; no deformity noted, no edema SKIN: Normal color for age and race; warm; no rash on exposed skin NEURO: Moves all extremities equally, normal speech PSYCH: The patient's mood and manner are appropriate.   ED Results / Procedures / Treatments   LABS: (all labs ordered are listed, but only abnormal results are displayed) Labs Reviewed  WET PREP, GENITAL - Abnormal; Notable for the following components:      Result Value   Clue Cells Wet Prep HPF POC PRESENT (*)    WBC, Wet Prep HPF POC >10 (*)    All other components within normal limits  CBC - Abnormal; Notable for the following components:   WBC 10.6 (*)    RBC 3.70 (*)    HCT 33.8 (*)    MCHC 37.0 (*)    RDW 11.0 (*)    All other components within normal limits  HCG, QUANTITATIVE, PREGNANCY - Abnormal; Notable for the following components:   hCG, Beta Chain, Quant, S 193,009 (*)    All other components within normal limits  URINALYSIS, ROUTINE W REFLEX MICROSCOPIC - Abnormal; Notable for the following components:   Color, Urine AMBER (*)    APPearance TURBID (*)    Hgb urine dipstick LARGE (*)    Leukocytes,Ua TRACE (*)    Bacteria, UA RARE (*)    All other components within normal limits  POC URINE PREG, ED - Abnormal; Notable for the following components:   Preg Test, Ur POSITIVE (*)    All other components within normal limits  CHLAMYDIA/NGC RT PCR ([facility] ONLY)            ABO/RH     EKG:   RADIOLOGY: My personal review and interpretation of imaging: OB ultrasound shows no torsion.  Normal IUP.  No subchorionic hemorrhage.  I have personally reviewed all radiology reports.   US OB Comp Less 14 Wks Result Date: 10/21/2023 CLINICAL DATA:  Pelvic pain and pregnancy EXAM: OBSTETRIC <14 WK US OB US DOPPLER ULTRASOUND OF OVARIES  TECHNIQUE: Transabdominal ultrasound examinations were performed for complete evaluation of the gestation as well as the maternal uterus, adnexal regions, and pelvic cul-de-sac. Transvaginal technique was performed to assess early pregnancy. Color and duplex Doppler ultrasound was utilized to evaluate blood flow to the ovaries. COMPARISON:  10/10/2023 FINDINGS: Intrauterine gestational sac: Single Yolk sac:  Visualized. Embryo:  Visualized. Cardiac Activity: Visualized. Heart Rate: 180 bpm CRL:   155  mm  7 w 2 d                  [place] EDC: 05/25/24 Subchorionic hemorrhage:  None Maternal uterus/adnexae: Unremarkable Pulsed Doppler evaluation of both ovaries demonstrates normal appearing low-resistance arterial and venous waveforms. IMPRESSION: 1. Single living intrauterine pregnancy measuring 7 weeks 2 days. No unexpected finding. 2. Normal appearance and blood flow of the ovaries. Electronically Signed   By: Tiburcio Pea M.D.   On: 10/21/2023 04:36   US ABDOMINAL PELVIC ART/VENT FLOW DOPPLER Result Date: 10/21/2023 CLINICAL DATA:  Pelvic pain and pregnancy EXAM: OBSTETRIC <14 WK US OB US DOPPLER ULTRASOUND OF OVARIES TECHNIQUE: Transabdominal ultrasound examinations were performed for complete evaluation of the gestation as well as the maternal uterus, adnexal regions, and pelvic cul-de-sac. Transvaginal technique was performed to assess early pregnancy. Color and duplex Doppler ultrasound was utilized to evaluate blood flow to the ovaries. COMPARISON:  10/10/2023 FINDINGS: Intrauterine gestational sac: Single Yolk sac:  Visualized. Embryo:  Visualized. Cardiac Activity: Visualized. Heart Rate: 180 bpm CRL:   155  mm   7 w 2 d                  [place] EDC: 05/25/24 Subchorionic hemorrhage:  None Maternal uterus/adnexae: Unremarkable Pulsed Doppler evaluation of both ovaries demonstrates normal appearing low-resistance arterial and venous waveforms. IMPRESSION: 1. Single living intrauterine pregnancy measuring 7 weeks 2  days. No unexpected finding. 2. Normal appearance and blood flow of the ovaries. Electronically Signed   By: Tiburcio Pea M.D.   On: 10/21/2023 04:36     PROCEDURES:  Critical Care performed: No      Procedures    IMPRESSION / MDM / ASSESSMENT AND PLAN / ED COURSE  I reviewed the triage vital signs and the nursing notes.    Patient here for pelvic pain, abnormal odor, vaginal bleeding in pregnancy.  The patient is on the cardiac monitor to evaluate for evidence of arrhythmia and/or significant heart rate changes.   DIFFERENTIAL DIAGNOSIS (includes but not limited to):   Implantation bleeding, threatened miscarriage, subchorionic hemorrhage, UTI, STI, BV, yeast.  Doubt appendicitis given benign exam.   Patient's presentation is most consistent with acute presentation with potential threat to life or bodily function.   PLAN: Will obtain labs, urine, transvaginal ultrasound with Doppler.  Will give Tylenol for discomfort.  Will obtain pelvic swabs.   MEDICATIONS GIVEN IN ED: Medications  metroNIDAZOLE (FLAGYL) tablet 500 mg (has no administration in time range)  acetaminophen (TYLENOL) tablet 1,000 mg (1,000 mg Oral Given 10/21/23 0409)  ondansetron (ZOFRAN) injection 4 mg (4 mg Intravenous Given 10/21/23 0355)  sodium chloride 0.9 % bolus 1,000 mL (1,000 mLs Intravenous New Bag/Given 10/21/23 0355)     ED COURSE: Patient now vomiting.  Will give IV fluids, Zofran.  Patient reports feeling better.  OB ultrasound reviewed and interpreted by myself and the radiologist.  IUP with normal fetal heart rate.  No subchorionic hemorrhage.  No torsion.  Urine shows no infection.  Wet prep positive for bacterial vaginosis.  Will discharge with prescription of Flagyl.  Gonorrhea and Chlamydia negative.  Patient tolerating p.o.   At this time, I do not feel there is any life-threatening condition present. I reviewed all nursing notes, vitals, pertinent previous records.  All lab and  urine results, EKGs, imaging ordered have been independently reviewed and interpreted by myself.  I reviewed all available radiology reports from any imaging ordered this visit.  Based on my assessment,  I feel the patient is safe to be discharged home without further emergent workup and can continue workup as an outpatient as needed. Discussed all findings, treatment plan as well as usual and customary return precautions.  They verbalize understanding and are comfortable with this plan.  Outpatient follow-up has been provided as needed.  All questions have been answered.   CONSULTS:  none   OUTSIDE RECORDS REVIEWED: Reviewed last OB/GYN note in March 2023.       FINAL CLINICAL IMPRESSION(S) / ED DIAGNOSES   Final diagnoses:  Vaginal bleeding in pregnancy  Bacterial vaginosis     Rx / DC Orders   ED Discharge Orders          Ordered    ondansetron (ZOFRAN-ODT) 4 MG disintegrating tablet  Every 6 hours PRN        10/21/23 0519    metroNIDAZOLE (FLAGYL) 500 MG tablet  2 times daily        10/21/23 2956             Note:  This document was prepared using Dragon voice recognition software and may include unintentional dictation errors.   Shaunda Tipping, Layla Maw, DO 10/21/23 0519    Santa Abdelrahman, Layla Maw, DO 10/21/23 [phone]

## 2023-10-24 ENCOUNTER — Encounter: Payer: Self-pay | Admitting: Emergency Medicine

## 2023-10-24 ENCOUNTER — Other Ambulatory Visit: Payer: Self-pay

## 2023-10-24 DIAGNOSIS — O99511 Diseases of the respiratory system complicating pregnancy, first trimester: Secondary | ICD-10-CM | POA: Diagnosis not present

## 2023-10-24 DIAGNOSIS — J45909 Unspecified asthma, uncomplicated: Secondary | ICD-10-CM | POA: Insufficient documentation

## 2023-10-24 DIAGNOSIS — Z91040 Latex allergy status: Secondary | ICD-10-CM | POA: Diagnosis not present

## 2023-10-24 DIAGNOSIS — E876 Hypokalemia: Secondary | ICD-10-CM | POA: Diagnosis not present

## 2023-10-24 DIAGNOSIS — Z3A09 9 weeks gestation of pregnancy: Secondary | ICD-10-CM | POA: Insufficient documentation

## 2023-10-24 DIAGNOSIS — O23591 Infection of other part of genital tract in pregnancy, first trimester: Secondary | ICD-10-CM | POA: Insufficient documentation

## 2023-10-24 DIAGNOSIS — O210 Mild hyperemesis gravidarum: Principal | ICD-10-CM | POA: Insufficient documentation

## 2023-10-24 DIAGNOSIS — Z79899 Other long term (current) drug therapy: Secondary | ICD-10-CM | POA: Diagnosis not present

## 2023-10-24 DIAGNOSIS — O99281 Endocrine, nutritional and metabolic diseases complicating pregnancy, first trimester: Secondary | ICD-10-CM | POA: Insufficient documentation

## 2023-10-24 LAB — URINALYSIS, ROUTINE W REFLEX MICROSCOPIC
Bilirubin Urine: NEGATIVE
Glucose, UA: NEGATIVE mg/dL
Hgb urine dipstick: NEGATIVE
Ketones, ur: 5 mg/dL — AB
Nitrite: NEGATIVE
Protein, ur: NEGATIVE mg/dL
Specific Gravity, Urine: 1.014 (ref 1.005–1.030)
pH: 8 (ref 5.0–8.0)

## 2023-10-24 LAB — COMPREHENSIVE METABOLIC PANEL
ALT: 14 U/L (ref 0–44)
AST: 20 U/L (ref 15–41)
Albumin: 4.2 g/dL (ref 3.5–5.0)
Alkaline Phosphatase: 35 U/L — ABNORMAL LOW (ref 38–126)
Anion gap: 10 (ref 5–15)
BUN: 8 mg/dL (ref 6–20)
CO2: 22 mmol/L (ref 22–32)
Calcium: 9.2 mg/dL (ref 8.9–10.3)
Chloride: 102 mmol/L (ref 98–111)
Creatinine, Ser: 0.51 mg/dL (ref 0.44–1.00)
GFR, Estimated: >60 mL/min (ref >60)
Glucose, Bld: 99 mg/dL (ref 70–99)
Potassium: 3.4 mmol/L — ABNORMAL LOW (ref 3.5–5.1)
Sodium: 134 mmol/L — ABNORMAL LOW (ref 135–145)
Total Bilirubin: 0.8 mg/dL (ref 0.0–1.2)
Total Protein: 6.6 g/dL (ref 6.5–8.1)

## 2023-10-24 LAB — CBC
HCT: 33.2 % — ABNORMAL LOW (ref 36.0–46.0)
Hemoglobin: 12.2 g/dL (ref 12.0–15.0)
MCH: 33.9 pg (ref 26.0–34.0)
MCHC: 36.7 g/dL — ABNORMAL HIGH (ref 30.0–36.0)
MCV: 92.2 fL (ref 80.0–100.0)
Platelets: 228 10*3/uL (ref 150–400)
RBC: 3.6 MIL/uL — ABNORMAL LOW (ref 3.87–5.11)
RDW: 11.4 % — ABNORMAL LOW (ref 11.5–15.5)
WBC: 8.5 10*3/uL (ref 4.0–10.5)
nRBC: 0 % (ref 0.0–0.2)

## 2023-10-24 LAB — LIPASE, BLOOD: Lipase: 29 U/L (ref 11–51)

## 2023-10-24 NOTE — ED Triage Notes (Signed)
 Pt arrived via POV with reports of continued vomiting during pregnancy, pt is 9+ weeks, states she is unable to keep anything down, states she took rectal phenergan  around 1800 with no relief.    G1 goes to [facility]  Denies any abdominal pain or vaginal bleeding at this time.

## 2023-10-25 ENCOUNTER — Observation Stay
Admission: EM | Admit: 2023-10-25 | Discharge: 2023-10-26 | Disposition: A | Discharge status: Home / Self Care | Payer: MEDICAID

## 2023-10-25 DIAGNOSIS — E876 Hypokalemia: Secondary | ICD-10-CM | POA: Diagnosis not present

## 2023-10-25 DIAGNOSIS — O210 Mild hyperemesis gravidarum: Secondary | ICD-10-CM | POA: Diagnosis present

## 2023-10-25 DIAGNOSIS — O99320 Drug use complicating pregnancy, unspecified trimester: Secondary | ICD-10-CM | POA: Diagnosis present

## 2023-10-25 DIAGNOSIS — R112 Nausea with vomiting, unspecified: Secondary | ICD-10-CM

## 2023-10-25 DIAGNOSIS — J45909 Unspecified asthma, uncomplicated: Secondary | ICD-10-CM | POA: Diagnosis present

## 2023-10-25 DIAGNOSIS — J4520 Mild intermittent asthma, uncomplicated: Secondary | ICD-10-CM

## 2023-10-25 DIAGNOSIS — O219 Vomiting of pregnancy, unspecified: Principal | ICD-10-CM

## 2023-10-25 LAB — CBC
HCT: 29.8 % — ABNORMAL LOW (ref 36.0–46.0)
Hemoglobin: 11.1 g/dL — ABNORMAL LOW (ref 12.0–15.0)
MCH: 33.8 pg (ref 26.0–34.0)
MCHC: 37.2 g/dL — ABNORMAL HIGH (ref 30.0–36.0)
MCV: 90.9 fL (ref 80.0–100.0)
Platelets: 198 10*3/uL (ref 150–400)
RBC: 3.28 MIL/uL — ABNORMAL LOW (ref 3.87–5.11)
RDW: 11.5 % (ref 11.5–15.5)
WBC: 8 10*3/uL (ref 4.0–10.5)
nRBC: 0 % (ref 0.0–0.2)

## 2023-10-25 LAB — HCG, QUANTITATIVE, PREGNANCY: hCG, Beta Chain, Quant, S: 175731 m[IU]/mL — ABNORMAL HIGH (ref <5)

## 2023-10-25 LAB — BASIC METABOLIC PANEL
Anion gap: 6 (ref 5–15)
BUN: 7 mg/dL (ref 6–20)
CO2: 23 mmol/L (ref 22–32)
Calcium: 8.5 mg/dL — ABNORMAL LOW (ref 8.9–10.3)
Chloride: 107 mmol/L (ref 98–111)
Creatinine, Ser: 0.56 mg/dL (ref 0.44–1.00)
GFR, Estimated: >60 mL/min (ref >60)
Glucose, Bld: 91 mg/dL (ref 70–99)
Potassium: 3.4 mmol/L — ABNORMAL LOW (ref 3.5–5.1)
Sodium: 136 mmol/L (ref 135–145)

## 2023-10-25 LAB — URINE DRUG SCREEN, QUALITATIVE (ARMC ONLY)
Amphetamines, Ur Screen: NOT DETECTED
Barbiturates, Ur Screen: NOT DETECTED
Benzodiazepine, Ur Scrn: NOT DETECTED
Cannabinoid 50 Ng, Ur ~~LOC~~: POSITIVE — AB
Cocaine Metabolite,Ur ~~LOC~~: NOT DETECTED
MDMA (Ecstasy)Ur Screen: NOT DETECTED
Methadone Scn, Ur: NOT DETECTED
Opiate, Ur Screen: NOT DETECTED
Phencyclidine (PCP) Ur S: NOT DETECTED
Tricyclic, Ur Screen: NOT DETECTED

## 2023-10-25 LAB — MAGNESIUM: Magnesium: 1.9 mg/dL (ref 1.7–2.4)

## 2023-10-25 MED ORDER — SODIUM CHLORIDE 0.9 % IV BOLUS
1000.0000 mL | Freq: Once | INTRAVENOUS | Status: AC
  Administered 2023-10-25: 1000 mL via INTRAVENOUS

## 2023-10-25 MED ORDER — ONDANSETRON HCL 4 MG/2ML IJ SOLN
4.0000 mg | Freq: Three times a day (TID) | INTRAMUSCULAR | Status: DC
  Administered 2023-10-25 (×2): 4 mg via INTRAVENOUS
  Filled 2023-10-25 (×2): qty 2

## 2023-10-25 MED ORDER — METOCLOPRAMIDE HCL 5 MG/ML IJ SOLN: 10.0000 mg | Freq: Four times a day (QID) | INTRAMUSCULAR | Status: DC | PRN

## 2023-10-25 MED ORDER — HYDROXYZINE HCL 50 MG/ML IM SOLN: 50.0000 mg | Freq: Four times a day (QID) | INTRAMUSCULAR | Status: DC | PRN

## 2023-10-25 MED ORDER — ACETAMINOPHEN 325 MG PO TABS
650.0000 mg | ORAL_TABLET | Freq: Four times a day (QID) | ORAL | Status: DC | PRN
  Administered 2023-10-25: 650 mg via ORAL
  Filled 2023-10-25: qty 2

## 2023-10-25 MED ORDER — POTASSIUM CHLORIDE 2 MEQ/ML IV SOLN
INTRAVENOUS | Status: AC
  Filled 2023-10-25 (×4): qty 1000

## 2023-10-25 MED ORDER — HYDROXYZINE HCL 25 MG PO TABS: 50.0000 mg | ORAL_TABLET | Freq: Four times a day (QID) | ORAL | Status: DC | PRN

## 2023-10-25 MED ORDER — FAMOTIDINE 20 MG PO TABS
20.0000 mg | ORAL_TABLET | Freq: Two times a day (BID) | ORAL | Status: DC
  Administered 2023-10-25 – 2023-10-26 (×3): 20 mg via ORAL
  Filled 2023-10-25 (×3): qty 1

## 2023-10-25 MED ORDER — ONDANSETRON HCL 4 MG/2ML IJ SOLN
4.0000 mg | Freq: Once | INTRAMUSCULAR | Status: AC
  Administered 2023-10-25: 4 mg via INTRAVENOUS
  Filled 2023-10-25: qty 2

## 2023-10-25 MED ORDER — TRAZODONE HCL 50 MG PO TABS: 25.0000 mg | ORAL_TABLET | Freq: Every evening | ORAL | Status: DC | PRN

## 2023-10-25 MED ORDER — POTASSIUM CHLORIDE IN NACL 20-0.9 MEQ/L-% IV SOLN
INTRAVENOUS | Status: DC
  Filled 2023-10-25 (×2): qty 1000

## 2023-10-25 MED ORDER — ACETAMINOPHEN 650 MG RE SUPP: 650.0000 mg | Freq: Four times a day (QID) | RECTAL | Status: DC | PRN

## 2023-10-25 MED ORDER — THIAMINE HCL 100 MG/ML IJ SOLN
Freq: Once | INTRAVENOUS | Status: AC
  Filled 2023-10-25: qty 1000

## 2023-10-25 MED ORDER — PROMETHAZINE HCL 25 MG RE SUPP
12.5000 mg | Freq: Four times a day (QID) | RECTAL | Status: DC
  Filled 2023-10-25 (×8): qty 1

## 2023-10-25 MED ORDER — ALBUTEROL SULFATE HFA 108 (90 BASE) MCG/ACT IN AERS: 1.0000 | INHALATION_SPRAY | Freq: Four times a day (QID) | RESPIRATORY_TRACT | Status: DC | PRN

## 2023-10-25 MED ORDER — SODIUM CHLORIDE 0.9 % IV SOLN
8.0000 mg | Freq: Three times a day (TID) | INTRAVENOUS | Status: DC
  Filled 2023-10-25 (×6): qty 4

## 2023-10-25 MED ORDER — ONDANSETRON HCL 4 MG PO TABS: 4.0000 mg | ORAL_TABLET | Freq: Four times a day (QID) | ORAL | Status: DC | PRN

## 2023-10-25 MED ORDER — POTASSIUM CHLORIDE 20 MEQ PO PACK
40.0000 meq | PACK | Freq: Once | ORAL | Status: AC
  Administered 2023-10-25: 40 meq via ORAL
  Filled 2023-10-25: qty 2

## 2023-10-25 MED ORDER — PROMETHAZINE HCL 25 MG PO TABS
12.5000 mg | ORAL_TABLET | Freq: Four times a day (QID) | ORAL | Status: DC
Start: 2023-10-25 — End: 2023-10-26
  Administered 2023-10-25 – 2023-10-26 (×5): 25 mg via ORAL
  Filled 2023-10-25 (×5): qty 1

## 2023-10-25 MED ORDER — ONDANSETRON HCL 4 MG/2ML IJ SOLN: 4.0000 mg | Freq: Four times a day (QID) | INTRAMUSCULAR | Status: DC | PRN

## 2023-10-25 MED ORDER — MECLIZINE HCL 25 MG PO TABS
25.0000 mg | ORAL_TABLET | Freq: Four times a day (QID) | ORAL | Status: DC
  Administered 2023-10-25 – 2023-10-26 (×5): 25 mg via ORAL
  Filled 2023-10-25 (×8): qty 1

## 2023-10-25 MED ORDER — METRONIDAZOLE 0.75 % VA GEL
1.0000 | Freq: Every day | VAGINAL | Status: DC
  Filled 2023-10-25: qty 70

## 2023-10-25 MED ORDER — ONDANSETRON 4 MG PO TBDP
4.0000 mg | ORAL_TABLET | Freq: Three times a day (TID) | ORAL | Status: DC
  Administered 2023-10-25 – 2023-10-26 (×3): 4 mg via ORAL
  Filled 2023-10-25 (×3): qty 1

## 2023-10-25 MED ORDER — FOLIC ACID 5 MG/ML IJ SOLN
1.0000 mg | Freq: Every day | INTRAMUSCULAR | Status: DC
  Filled 2023-10-25: qty 0.2

## 2023-10-25 MED ORDER — METOCLOPRAMIDE HCL 5 MG/ML IJ SOLN
10.0000 mg | Freq: Once | INTRAMUSCULAR | Status: AC
  Administered 2023-10-25: 10 mg via INTRAVENOUS
  Filled 2023-10-25: qty 2

## 2023-10-25 MED ORDER — MAGNESIUM HYDROXIDE 400 MG/5ML PO SUSP: 30.0000 mL | Freq: Every day | ORAL | Status: DC | PRN

## 2023-10-25 MED ORDER — ENOXAPARIN SODIUM 40 MG/0.4ML IJ SOSY
40.0000 mg | PREFILLED_SYRINGE | INTRAMUSCULAR | Status: DC
  Administered 2023-10-25 – 2023-10-26 (×2): 40 mg via SUBCUTANEOUS
  Filled 2023-10-25 (×2): qty 0.4

## 2023-10-25 MED ORDER — FOLIC ACID 1 MG PO TABS
1.0000 mg | ORAL_TABLET | Freq: Every day | ORAL | Status: DC
  Administered 2023-10-26: 1 mg via ORAL
  Filled 2023-10-25: qty 1

## 2023-10-25 MED ORDER — FAMOTIDINE IN NACL 20-0.9 MG/50ML-% IV SOLN
20.0000 mg | Freq: Two times a day (BID) | INTRAVENOUS | Status: DC
  Filled 2023-10-25 (×4): qty 50

## 2023-10-25 MED ORDER — IPRATROPIUM-ALBUTEROL 0.5-2.5 (3) MG/3ML IN SOLN: 3.0000 mL | Freq: Four times a day (QID) | RESPIRATORY_TRACT | Status: DC | PRN

## 2023-10-25 NOTE — Assessment & Plan Note (Signed)
-   Potassium will be replaced and magnesium level will be checked. ?

## 2023-10-25 NOTE — ED Notes (Signed)
 Pt contact made and myself introduced. Pt is CAOx4, breathing normally, and normal in color. Pt states she is in no pain however is very nauseous. Pt is resting in bed at this time and has friend by bedside.

## 2023-10-25 NOTE — ED Provider Notes (Signed)
 [facility] Provider Note    Event Date/Time   First MD Initiated Contact with Patient 10/25/23 0139     (approximate)   History   Emesis During Pregnancy   HPI  Tiffany Bush is a 23 y.o. female   Past medical history of approximately [redacted] weeks pregnant here with intractable nausea and vomiting has tried multiple antiemetic medications at home but persistently vomiting.  Recently started on Flagyl  for bacterial vaginosis.  Symptoms have worsened since then.  Took a rectal Phenergan  today and continues to vomit.  No GI bleeding, no vaginal bleeding.  No urinary symptoms.  Independent Historian contributed to assessment above: Her partner is at bedside to corroborate information past medical history as above  External Medical Documents Reviewed: Previous ultrasound imaging obtained 10/21/2023 with singleton intrauterine pregnancy noted      Physical Exam   Triage Vital Signs: ED Triage Vitals  Encounter Vitals Group     BP 10/24/23 2307 120/63     Systolic BP Percentile --      Diastolic BP Percentile --      Pulse Rate 10/24/23 2307 (!) 59     Resp 10/24/23 2307 18     Temp 10/24/23 2307 98.2 F (36.8 C)     Temp Source 10/24/23 2307 Oral     SpO2 10/24/23 2307 98 %     Weight 10/24/23 2308 120 lb (54.4 kg)     Height 10/24/23 2308 5' (1.524 m)     Head Circumference --      Peak Flow --      Pain Score 10/24/23 2308 0     Pain Loc --      Pain Education --      Exclude from Growth Chart --     Most recent vital signs: Vitals:   10/25/23 0051 10/25/23 0320  BP: (!) 111/59   Pulse: (!) 54   Resp: 16   Temp:  98.4 F (36.9 C)  SpO2: 99%     General: Awake, no distress.  CV:  Good peripheral perfusion.  Resp:  Normal effort.  Abd:  No distention.  Other:  awake alert comfortable appearing with normal vital signs, nontoxic appearance.  Soft benign abdominal exam deep palpation all quadrants.   ED Results / Procedures / Treatments    Labs (all labs ordered are listed, but only abnormal results are displayed) Labs Reviewed  COMPREHENSIVE METABOLIC PANEL - Abnormal; Notable for the following components:      Result Value   Sodium 134 (*)    Potassium 3.4 (*)    Alkaline Phosphatase 35 (*)    All other components within normal limits  CBC - Abnormal; Notable for the following components:   RBC 3.60 (*)    HCT 33.2 (*)    MCHC 36.7 (*)    RDW 11.4 (*)    All other components within normal limits  URINALYSIS, ROUTINE W REFLEX MICROSCOPIC - Abnormal; Notable for the following components:   Color, Urine YELLOW (*)    APPearance TURBID (*)    Ketones, ur 5 (*)    Leukocytes,Ua SMALL (*)    Bacteria, UA RARE (*)    All other components within normal limits  HCG, QUANTITATIVE, PREGNANCY - Abnormal; Notable for the following components:   hCG, Beta Chain, Quant, S 175,731 (*)    All other components within normal limits  LIPASE, BLOOD     I ordered and reviewed the above labs they  are notable for ketones in the urine.  Otherwise electrolytes cell counts unremarkable.   PROCEDURES:  Critical Care performed: No  Procedures   MEDICATIONS ORDERED IN ED: Medications  metoCLOPramide  (REGLAN ) injection 10 mg (has no administration in time range)  sodium chloride  0.9 % bolus 1,000 mL (1,000 mLs Intravenous Bolus 10/25/23 0154)  ondansetron  (ZOFRAN ) injection 4 mg (4 mg Intravenous Given 10/25/23 0154)    External physician / consultants:  I spoke with hospital medicine for admission and regarding care plan for this patient.   IMPRESSION / MDM / ASSESSMENT AND PLAN / ED COURSE  I reviewed the triage vital signs and the nursing notes.                                Patient's presentation is most consistent with acute presentation with potential threat to life or bodily function.  Differential diagnosis includes, but is not limited to, hyperemesis gravidarum, electrolyte disturbances, dehydration, considered  but less likely intra-abdominal infection   The patient is on the cardiac monitor to evaluate for evidence of arrhythmia and/or significant heart rate changes.  MDM:    First trimester pregnancy with confirmed IUP and primary imaging here with intractable nausea and vomiting.  Electrolytes within normal limits.  Despite multiple antiemetics, IV hydration, patient continues to vomit.  Will admit for intractable nausea and vomiting.  Benign abdominal exam rules against surgical abdominal pathologies at this time.      FINAL CLINICAL IMPRESSION(S) / ED DIAGNOSES   Final diagnoses:  None     Rx / DC Orders   ED Discharge Orders     None        Note:  This document was prepared using Dragon voice recognition software and may include unintentional dictation errors.    Cyrena Mylar, MD 10/25/23 [phone]

## 2023-10-25 NOTE — H&P (Addendum)
    PATIENT NAME: Tiffany Bush    MR#:  [id number]  DATE OF BIRTH:  07-09-2001  DATE OF ADMISSION:  10/25/2023  PRIMARY CARE PHYSICIAN: Emilio Kelly DASEN, FNP (Inactive)   Patient is coming from: Home  REQUESTING/REFERRING PHYSICIAN: Cyrena Mylar, MD  CHIEF COMPLAINT:   Chief Complaint  Patient presents with   Emesis During Pregnancy    HISTORY OF PRESENT ILLNESS:  Tiffany Bush is a 23 y.o. Caucasian female with medical history significant for asthma, anxiety and urolithiasis who is [redacted] weeks pregnant, who presented to the emergency room with acute onset intractable nausea and vomiting for the last few days.  She has been having intermittent nausea and vomiting over the last 4 weeks.  She has used Zofran , Phenergan  and Reglan  at home.  She denies any fever or chills.  No abdominal pain.  No bleeding diathesis.  No dysuria, oliguria or hematuria or flank pain.  No cough or wheezing or hemoptysis.  No chest pain or palpitations.  ED Course: When the patient came to the ER, heart rate was 59 with otherwise normal vital signs.  Labs revealed hypokalemia of 3.4 with hyponatremia 134 and otherwise unremarkable CMP.  CBC was within normal.  Glucose was 99.  Beta-hCG was elevated consistent with pregnancy.  She had a positive pregnancy test on 2/3.  UA was unremarkable. EKG as reviewed by me : None Imaging: OB ultrasound on 10/21/2023 revealed the following: 1. Single living intrauterine pregnancy measuring 7 weeks 2 days. No unexpected finding. 2. Normal appearance and blood flow of the ovaries.  The patient was given 4 mg of IV Zofran , 1 L bolus of IV normal saline and 10 mg of IV Reglan  and added 40 mill [place]ell p.o. potassium chloride .  She will be admitted to a medical observation bed for further evaluation and management. PAST MEDICAL HISTORY:   Past Medical History:  Diagnosis Date   Anxiety    no meds    Asthma    well controlled-exercise induced   Complication of  anesthesia    Family history of adverse reaction to anesthesia    mom-n/v   Headache    more frequent during the summer with her allergies   Kidney stones    PONV (postoperative nausea and vomiting)    nausea    PAST SURGICAL HISTORY:   Past Surgical History:  Procedure Laterality Date   CYSTOSCOPY     LAPAROSCOPY N/A 04/14/2018   Procedure: LAPAROSCOPY DIAGNOSTIC;  Surgeon: Lovetta Debby PARAS, MD;  Location: [facility] ORS;  Service: Gynecology;  Laterality: N/A;   LAPAROSCOPY N/A 04/14/2018   Procedure: LAPAROSCOPY OPERATIVE;  Surgeon: Schermerhorn, Debby PARAS, MD;  Location: [facility] ORS;  Service: Gynecology;  Laterality: N/A;   TONSILLECTOMY     age 31    SOCIAL HISTORY:   Social History   Tobacco Use   Smoking status: Never   Smokeless tobacco: Never  Substance Use Topics   Alcohol use: Yes    Comment: occ    FAMILY HISTORY:  History reviewed. No pertinent family history.  DRUG ALLERGIES:   Allergies  Allergen Reactions   Zoloft  [Sertraline  Hcl] Other (See Comments)    Suicidal thoughts    Doxycycline  Nausea And Vomiting    Pill/capsule only Patient did tolerate IV doxycycline  without complication   Latex Rash    REVIEW OF SYSTEMS:   ROS As per history of present illness. All pertinent systems were reviewed above. Constitutional, HEENT, cardiovascular,  respiratory, GI, GU, musculoskeletal, neuro, psychiatric, endocrine, integumentary and hematologic systems were reviewed and are otherwise negative/unremarkable except for positive findings mentioned above in the HPI.   MEDICATIONS AT HOME:   Prior to Admission medications   Medication Sig Start Date End Date Taking? Authorizing Provider  metoCLOPramide  (REGLAN ) 10 MG tablet Take 1 tablet (10 mg total) by mouth every 8 (eight) hours as needed for up to 7 days for nausea or vomiting. 10/10/23 10/17/23  Kingston Mallick, PA-C  metroNIDAZOLE  (FLAGYL ) 500 MG tablet Take 1 tablet (500 mg total) by mouth 2 (two) times daily.  Do not drink alcohol with this medication. 10/21/23   Ward, Josette SAILOR, DO  ondansetron  (ZOFRAN -ODT) 4 MG disintegrating tablet Take 1 tablet (4 mg total) by mouth every 6 (six) hours as needed for nausea or vomiting. 10/21/23   Ward, Josette SAILOR, DO  traZODone  (DESYREL ) 50 MG tablet Take 1 tablet (50 mg total) by mouth at bedtime as needed for sleep. Patient not taking: Reported on 09/09/2023 06/17/23   Emilio Marseille T, FNP      VITAL SIGNS:  Blood pressure 116/62, pulse (!) 53, temperature 98.4 F (36.9 C), temperature source Oral, resp. rate 18, height 5' (1.524 m), weight 54.4 kg, last menstrual period 08/20/2023, SpO2 99%.  PHYSICAL EXAMINATION:  Physical Exam  GENERAL:  23 y.o.-year-old patient lying in the bed with no acute distress.  EYES: Pupils equal, round, reactive to light and accommodation. No scleral icterus. Extraocular muscles intact.  HEENT: Head atraumatic, normocephalic. Oropharynx and nasopharynx clear.  NECK:  Supple, no jugular venous distention. No thyroid enlargement, no tenderness.  LUNGS: Normal breath sounds bilaterally, no wheezing, rales,rhonchi or crepitation. No use of accessory muscles of respiration.  CARDIOVASCULAR: Regular rate and rhythm, S1, S2 normal. No murmurs, rubs, or gallops.  ABDOMEN: Soft, nondistended, nontender. Bowel sounds present. No organomegaly or mass.  EXTREMITIES: No pedal edema, cyanosis, or clubbing.  NEUROLOGIC: Cranial nerves II through XII are intact. Muscle strength 5/5 in all extremities. Sensation intact. Gait not checked.  PSYCHIATRIC: The patient is alert and oriented x 3.  Normal affect and good eye contact. SKIN: No obvious rash, lesion, or ulcer.   LABORATORY PANEL:   CBC Recent Labs  Lab 10/25/23 0429  WBC 8.0  HGB 11.1*  HCT 29.8*  PLT 198   ------------------------------------------------------------------------------------------------------------------  Chemistries  Recent Labs  Lab 10/24/23 2310 10/25/23 0429   NA 134* 136  K 3.4* 3.4*  CL 102 107  CO2 22 23  GLUCOSE 99 91  BUN 8 7  CREATININE 0.51 0.56  CALCIUM 9.2 8.5*  MG  --  1.9  AST 20  --   ALT 14  --   ALKPHOS 35*  --   BILITOT 0.8  --    ------------------------------------------------------------------------------------------------------------------  Cardiac Enzymes No results for input(s): TROPONINI in the last 168 hours. ------------------------------------------------------------------------------------------------------------------  RADIOLOGY:  No results found.    IMPRESSION AND PLAN:  Assessment and Plan: * Intractable nausea and vomiting - This could be related to hyperemesis gravidarum. - The patient will be admitted to an observation medical bed. - We will continue hydration with IV normal saline. - We will place on as needed IV Reglan  and Zofran . - OB consult will be obtained. - I notified Dr. Lovetta about the patient.  Hypokalemia - Potassium will be replaced and magnesium  level will be checked.  Asthma, chronic  She will be placed on as needed DuoNeb.     DVT prophylaxis: Lovenox .  Advanced  Care Planning:  Code Status: full code.  Family Communication:  The plan of care was discussed in details with the patient (and family). I answered all questions. The patient agreed to proceed with the above mentioned plan. Further management will depend upon hospital course. Disposition Plan: Back to previous home environment Consults called:  OB All the records are reviewed and case discussed with ED provider.  Status is: Observation  I certify that at the time of admission, it is my clinical judgment that the patient will require hospital care extending LESS than 2 midnights.                            Dispo: The patient is from: Home              Anticipated d/c is to: Home              Patient currently is not medically stable to d/c.              Difficult to place patient: No  Madison DELENA Peaches  M.D on 10/25/2023 at 6:09 AM  [facility]   From 7 PM-7 AM, contact night-coverage [email or web address]  CC: Primary care physician; Emilio Kelly DASEN, FNP (Inactive)

## 2023-10-25 NOTE — Assessment & Plan Note (Signed)
-   This could be related to hyperemesis gravidarum. - The patient will be admitted to an observation medical bed. - We will continue hydration with IV normal saline. - We will place on as needed IV Reglan  and Zofran . - OB consult will be obtained. - I notified Dr. Lovetta about the patient.

## 2023-10-25 NOTE — Assessment & Plan Note (Signed)
-   She will be placed on as needed DuoNeb.
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
## 2023-10-26 LAB — BASIC METABOLIC PANEL
Anion gap: 6 (ref 5–15)
BUN: <5 mg/dL — ABNORMAL LOW (ref 6–20)
CO2: 21 mmol/L — ABNORMAL LOW (ref 22–32)
Calcium: 8.3 mg/dL — ABNORMAL LOW (ref 8.9–10.3)
Chloride: 110 mmol/L (ref 98–111)
Creatinine, Ser: 0.48 mg/dL (ref 0.44–1.00)
GFR, Estimated: >60 mL/min (ref >60)
Glucose, Bld: 79 mg/dL (ref 70–99)
Potassium: 3.7 mmol/L (ref 3.5–5.1)
Sodium: 137 mmol/L (ref 135–145)

## 2023-10-26 LAB — CBC
HCT: 28.6 % — ABNORMAL LOW (ref 36.0–46.0)
Hemoglobin: 10.4 g/dL — ABNORMAL LOW (ref 12.0–15.0)
MCH: 33.2 pg (ref 26.0–34.0)
MCHC: 36.4 g/dL — ABNORMAL HIGH (ref 30.0–36.0)
MCV: 91.4 fL (ref 80.0–100.0)
Platelets: 197 10*3/uL (ref 150–400)
RBC: 3.13 MIL/uL — ABNORMAL LOW (ref 3.87–5.11)
RDW: 11.7 % (ref 11.5–15.5)
WBC: 6.4 10*3/uL (ref 4.0–10.5)
nRBC: 0 % (ref 0.0–0.2)

## 2023-10-26 MED ORDER — METRONIDAZOLE 0.75 % VA GEL: 1.0000 | Freq: Every day | VAGINAL | 0 refills | Status: DC

## 2023-10-26 MED ORDER — FAMOTIDINE 20 MG PO TABS: 20.0000 mg | ORAL_TABLET | Freq: Two times a day (BID) | ORAL | 0 refills | Status: DC

## 2023-10-26 MED ORDER — HYDROXYZINE HCL 50 MG PO TABS: 50.0000 mg | ORAL_TABLET | Freq: Four times a day (QID) | ORAL | 0 refills | Status: DC | PRN

## 2023-10-26 MED ORDER — ALBUTEROL SULFATE HFA 108 (90 BASE) MCG/ACT IN AERS: 1.0000 | INHALATION_SPRAY | Freq: Four times a day (QID) | RESPIRATORY_TRACT | 1 refills | Status: AC | PRN
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
## 2023-10-26 NOTE — Plan of Care (Signed)
 Patient discharged home. Discharge instructions and prescriptions given and reviewed with patient. Patient verbalized understanding. Pt walked out with family.

## 2023-10-28 DIAGNOSIS — O0993 Supervision of high risk pregnancy, unspecified, third trimester: Secondary | ICD-10-CM | POA: Insufficient documentation

## 2023-10-28 MED FILL — Folic Acid Inj 5 MG/ML: INTRAMUSCULAR | Qty: 0.2 | Status: AC

## 2023-10-28 MED FILL — Lactated Ringer's Solution: INTRAVENOUS | Qty: 1000 | Status: AC

## 2023-10-28 MED FILL — Thiamine HCl Inj 100 MG/ML: INTRAMUSCULAR | Qty: 1 | Status: AC

## 2023-10-28 MED FILL — Multiple Vitamin IV Soln: INTRAVENOUS | Qty: 10 | Status: AC

## 2023-10-29 LAB — OB RESULTS CONSOLE RUBELLA ANTIBODY, IGM: Rubella: NON-IMMUNE/NOT IMMUNE

## 2023-10-29 LAB — OB RESULTS CONSOLE HEPATITIS B SURFACE ANTIGEN: Hepatitis B Surface Ag: NEGATIVE

## 2023-10-29 LAB — OB RESULTS CONSOLE VARICELLA ZOSTER ANTIBODY, IGG: Varicella: IMMUNE

## 2023-12-06 ENCOUNTER — Emergency Department
Admission: EM | Admit: 2023-12-06 | Discharge: 2023-12-06 | Disposition: A | Discharge status: Home / Self Care | Payer: MEDICAID | Attending: Emergency Medicine | Admitting: Emergency Medicine

## 2023-12-06 ENCOUNTER — Other Ambulatory Visit: Payer: Self-pay

## 2023-12-06 DIAGNOSIS — R109 Unspecified abdominal pain: Secondary | ICD-10-CM | POA: Diagnosis not present

## 2023-12-06 DIAGNOSIS — Z3A15 15 weeks gestation of pregnancy: Secondary | ICD-10-CM | POA: Insufficient documentation

## 2023-12-06 DIAGNOSIS — O26892 Other specified pregnancy related conditions, second trimester: Secondary | ICD-10-CM | POA: Insufficient documentation

## 2023-12-06 DIAGNOSIS — Z3490 Encounter for supervision of normal pregnancy, unspecified, unspecified trimester: Secondary | ICD-10-CM

## 2023-12-06 LAB — CBC
HCT: 30.9 % — ABNORMAL LOW (ref 36.0–46.0)
Hemoglobin: 11.6 g/dL — ABNORMAL LOW (ref 12.0–15.0)
MCH: 34.4 pg — ABNORMAL HIGH (ref 26.0–34.0)
MCHC: 37.5 g/dL — ABNORMAL HIGH (ref 30.0–36.0)
MCV: 91.7 fL (ref 80.0–100.0)
Platelets: 218 10*3/uL (ref 150–400)
RBC: 3.37 MIL/uL — ABNORMAL LOW (ref 3.87–5.11)
RDW: 13 % (ref 11.5–15.5)
WBC: 9.3 10*3/uL (ref 4.0–10.5)
nRBC: 0 % (ref 0.0–0.2)

## 2023-12-06 LAB — BASIC METABOLIC PANEL
Anion gap: 6 (ref 5–15)
BUN: 6 mg/dL (ref 6–20)
CO2: 24 mmol/L (ref 22–32)
Calcium: 8.7 mg/dL — ABNORMAL LOW (ref 8.9–10.3)
Chloride: 105 mmol/L (ref 98–111)
Creatinine, Ser: 0.47 mg/dL (ref 0.44–1.00)
GFR, Estimated: >60 mL/min (ref >60)
Glucose, Bld: 70 mg/dL (ref 70–99)
Potassium: 3.6 mmol/L (ref 3.5–5.1)
Sodium: 135 mmol/L (ref 135–145)

## 2023-12-06 LAB — HCG, QUANTITATIVE, PREGNANCY: hCG, Beta Chain, Quant, S: 48336 m[IU]/mL — ABNORMAL HIGH (ref <5)

## 2023-12-06 NOTE — ED Notes (Signed)
 Patient denies pain and is resting comfortably.
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
## 2024-01-07 ENCOUNTER — Observation Stay
Admission: EM | Admit: 2024-01-07 | Discharge: 2024-01-07 | Disposition: A | Discharge status: Home / Self Care | Payer: MEDICAID | Attending: Obstetrics and Gynecology | Admitting: Obstetrics and Gynecology

## 2024-01-07 ENCOUNTER — Encounter: Payer: Self-pay | Admitting: Obstetrics and Gynecology

## 2024-01-07 ENCOUNTER — Other Ambulatory Visit: Payer: Self-pay

## 2024-01-07 DIAGNOSIS — O99512 Diseases of the respiratory system complicating pregnancy, second trimester: Secondary | ICD-10-CM | POA: Insufficient documentation

## 2024-01-07 DIAGNOSIS — Z91040 Latex allergy status: Secondary | ICD-10-CM | POA: Insufficient documentation

## 2024-01-07 DIAGNOSIS — O26852 Spotting complicating pregnancy, second trimester: Secondary | ICD-10-CM | POA: Diagnosis present

## 2024-01-07 DIAGNOSIS — J45909 Unspecified asthma, uncomplicated: Secondary | ICD-10-CM | POA: Diagnosis not present

## 2024-01-07 DIAGNOSIS — Z3A20 20 weeks gestation of pregnancy: Secondary | ICD-10-CM | POA: Diagnosis not present

## 2024-01-07 DIAGNOSIS — O26859 Spotting complicating pregnancy, unspecified trimester: Principal | ICD-10-CM | POA: Diagnosis present

## 2024-01-07 DIAGNOSIS — K649 Unspecified hemorrhoids: Secondary | ICD-10-CM | POA: Clinically undetermined

## 2024-01-07 MED ORDER — PHENYLEPHRINE-MINERAL OIL-PET 0.25-14-74.9 % RE OINT: 1.0000 | TOPICAL_OINTMENT | Freq: Two times a day (BID) | RECTAL | 0 refills | Status: DC | PRN

## 2024-01-07 MED ORDER — DOCUSATE SODIUM 100 MG PO CAPS: 100.0000 mg | ORAL_CAPSULE | Freq: Two times a day (BID) | ORAL | 0 refills | Status: AC

## 2024-01-07 NOTE — Progress Notes (Signed)
 Speculum exam performed at bedside with provider and rn present.

## 2024-01-07 NOTE — Discharge Summary (Addendum)
 Tiffany Bush is a 23 y.o. female. She is at [redacted]w[redacted]d gestation. Patient's last menstrual period was 08/20/2023 (exact date). Estimated Date of Delivery: 05/26/24  Prenatal care site: [facility]  Chief complaint: Spotting in pregnancy  HPI: [name] presents to L&D with concerns of spotting in second trimester. She accompanied by her stepfather. She reports she notice one spot of blood on the toilet paper when she wiped after using the restroom today. She was unable to recall the exact time. She states she had experienced a bowel movement in which she strained a little and had difficulty passing. She also reports she has a h/o hemorrhoids. Patient also expresses that she has intercourse on Friday (04/18). She denies vaginal bleeding, contractions, and leakage of fluid.   Factors complicating pregnancy: Insomnia Anxiety Depression  Asthma Hyperemesis Abnormal Pap Smear  H/o THC use  Rubella Non Immune   S: Resting comfortably. No contractions, no vaginal bleeding, and no leakage of fluid.   Maternal Medical History:  Past Medical Hx:  has a past medical history of Anxiety, Asthma, Complication of anesthesia, Family history of adverse reaction to anesthesia, Headache, Kidney stones, and PONV (postoperative nausea and vomiting).    Past Surgical Hx:  has a past surgical history that includes Tonsillectomy; Cystoscopy; laparoscopy (N/A, 04/14/2018); and laparoscopy (N/A, 04/14/2018).   Allergies  Allergen Reactions   Zoloft  [Sertraline  Hcl] Other (See Comments)    Suicidal thoughts    Doxycycline  Nausea And Vomiting    Pill/capsule only Patient did tolerate IV doxycycline  without complication   Latex Rash     Prior to Admission medications   Medication Sig Start Date End Date Taking? Authorizing Provider  docusate sodium  (COLACE) 100 MG capsule Take 1 capsule (100 mg total) by mouth 2 (two) times daily. 01/07/24 02/06/24 Yes Devyn Griffing, CNM  phenylephrine -shark liver  oil-mineral oil-petrolatum (PREPARATION H) 0.25-14-74.9 % rectal ointment Place 1 Application rectally 2 (two) times daily as needed for hemorrhoids. 01/07/24  Yes Kaya Pottenger, CNM  albuterol  (VENTOLIN  HFA) 108 (90 Base) MCG/ACT inhaler Inhale 1-2 puffs into the lungs every 6 (six) hours as needed for wheezing or shortness of breath. 10/26/23   Dickerson, Felicia, CNM  famotidine  (PEPCID ) 20 MG tablet Take 1 tablet (20 mg total) by mouth every 12 (twelve) hours. 10/26/23   Dickerson, Felicia, CNM  hydrOXYzine  (ATARAX ) 50 MG tablet Take 1 tablet (50 mg total) by mouth every 6 (six) hours as needed for nausea or vomiting. 10/26/23   Dickerson, Felicia, CNM  metoCLOPramide  (REGLAN ) 10 MG tablet Take 1 tablet (10 mg total) by mouth every 8 (eight) hours as needed for up to 7 days for nausea or vomiting. 10/10/23 10/17/23  Hollie Luria, PA-C  metroNIDAZOLE  (METROGEL ) 0.75 % vaginal gel Place 1 Applicatorful vaginally at bedtime. 10/26/23   Dickerson, Felicia, CNM  ondansetron  (ZOFRAN -ODT) 4 MG disintegrating tablet Take 1 tablet (4 mg total) by mouth every 6 (six) hours as needed for nausea or vomiting. 10/21/23   Ward, Clover Dao, DO  promethazine  (PHENERGAN ) 25 MG suppository Place 25 mg rectally every 6 (six) hours as needed. 10/23/23   [provider]  traZODone  (DESYREL ) 50 MG tablet Take 1 tablet (50 mg total) by mouth at bedtime as needed for sleep. Patient not taking: Reported on 09/09/2023 06/17/23   Normie Becton, FNP    Social History: She  reports that she has never smoked. She has never used smokeless tobacco. She reports current alcohol use. She reports current drug use.  Drug: Marijuana.  Family History: family history is not on file.   Review of Systems:  Review of Systems  Constitutional: Negative.   Genitourinary:        Reports one spot of blood when wiping after using restroom   Psychiatric/Behavioral: Negative.       O:  BP 124/60   Pulse 77   Temp 98.9 F (37.2 C) (Oral)    LMP 08/20/2023 (Exact Date)  No results found for this or any previous visit (from the past 48 hours).   Constitutional: NAD, AAOx3  HE/ENT: extraocular movements grossly intact, moist mucous membranes CV: RRR PULM: nl respiratory effort, CTABL Abd: gravid, non-tender, non-distended, soft  Ext: Non-tender, Nonedmeatous Psych: mood appropriate, speech normal Pelvic : normal white physiologic discharge observed. No vaginal bleeding or pooling observed during speculum exam. Cervix visualized to be closed.  SVE:   Deferred   FHR via Doppler: 157 bpm    Assessment: 23 y.o. [redacted]w[redacted]d here for antenatal surveillance during pregnancy.  Principle diagnosis: Spotting in pregnancy and hemorrhoids   Plan: Spotting in pregnancy Patient reports notices "one spot" when wiping after using the restroom and reports straining a little when having a bowel movement as well as having sexual intercourse in the past 4 days. Patient reassured that this is a common/normal finding after doing these activities and usually will resolve on its own.  No bleeding observed during speculum exam. Cervix visualized to be closed.  Patient instructed to follow-up with OB provider if symptoms worsen (increase in vaginal bleeding) and do not improve. Patient verbalized understanding.   Education on vaginal bleeding during pregnancy discussed and provided to patient in AVS.   2. Hemorrhoids Prescription for Preparation H cream sent to preferred pharmacy. Patient advised to apply as needed. In addition, witch hazel pads and increase fluid intake also recommended to help manage symptoms.  Prescription for Colace 100 mg PO BID sent to preferred pharmacy.  Education on hemorrhoids reviewed with patient and provided to patient in  AVS.  - All questions answered. Patient had no further questions or concerns.  - D/c home stable, precautions reviewed, follow-up as needed.   Allergies as of 01/07/2024       Reactions   Zoloft   [sertraline  Hcl] Other (See Comments)   Suicidal thoughts    Doxycycline  Nausea And Vomiting   Pill/capsule only Patient did tolerate IV doxycycline  without complication   Latex Rash        Medication List     STOP taking these medications    metroNIDAZOLE  0.75 % vaginal gel Commonly known as: METROGEL    traZODone  50 MG tablet Commonly known as: DESYREL        TAKE these medications    albuterol  108 (90 Base) MCG/ACT inhaler Commonly known as: VENTOLIN  HFA Inhale 1-2 puffs into the lungs every 6 (six) hours as needed for wheezing or shortness of breath.   docusate sodium  100 MG capsule Commonly known as: Colace Take 1 capsule (100 mg total) by mouth 2 (two) times daily.   famotidine  20 MG tablet Commonly known as: PEPCID  Take 1 tablet (20 mg total) by mouth every 12 (twelve) hours.   hydrOXYzine  50 MG tablet Commonly known as: ATARAX  Take 1 tablet (50 mg total) by mouth every 6 (six) hours as needed for nausea or vomiting.   metoCLOPramide  10 MG tablet Commonly known as: REGLAN  Take 1 tablet (10 mg total) by mouth every 8 (eight) hours as needed for up to 7 days for nausea  or vomiting.   ondansetron  4 MG disintegrating tablet Commonly known as: ZOFRAN -ODT Take 1 tablet (4 mg total) by mouth every 6 (six) hours as needed for nausea or vomiting.   phenylephrine -shark liver oil-mineral oil-petrolatum 0.25-14-74.9 % rectal ointment Commonly known as: PREPARATION H Place 1 Application rectally 2 (two) times daily as needed for hemorrhoids.   promethazine  25 MG suppository Commonly known as: PHENERGAN  Place 25 mg rectally every 6 (six) hours as needed.         ----- Ashantee Deupree, CNM Certified Nurse Midwife [facility]

## 2024-01-07 NOTE — OB Triage Note (Signed)
 Patient is G1P0 is [redacted]w[redacted]d is seen at [facility] and is c/o one spot of blood when wiped earlier today. Pt denies other abnormal discharge, denies s/s of UTI. Patient last had intercourse on Friday. Patient also states some pain near her anus. Pt unsure of hemorrhoids.  Pt also sick recently. VSS. FHT 155 via doppler. Provider to be notified.

## 2024-01-08 DIAGNOSIS — K649 Unspecified hemorrhoids: Secondary | ICD-10-CM | POA: Clinically undetermined

## 2024-03-05 DIAGNOSIS — O2603 Excessive weight gain in pregnancy, third trimester: Secondary | ICD-10-CM | POA: Diagnosis present

## 2024-03-05 LAB — OB RESULTS CONSOLE HIV ANTIBODY (ROUTINE TESTING): HIV: NONREACTIVE

## 2024-04-29 LAB — OB RESULTS CONSOLE GC/CHLAMYDIA
Chlamydia: NEGATIVE
Neisseria Gonorrhea: NEGATIVE

## 2024-04-29 LAB — OB RESULTS CONSOLE GBS: GBS: POSITIVE

## 2024-05-08 NOTE — Progress Notes (Signed)
 Obstetrics & Gynecology Office Visit  Subjective  Tiffany Bush is a 23 y.o. G1P0000 at [redacted]w[redacted]d being seen today for ongoing prenatal care.  She is currently monitored for tthis high-risk pregnancy.  Patient's last menstrual period was 08/20/2023 (exact date). Estimated Date of Delivery: 05/26/24  History of Present Illness Tiffany Bush is a 23 year old female who presents for routine prenatal appointment  She experiences mild cramping but no significant contractions. There is no regularity or intensity to the contractions that would indicate active labor.  She noticed what she believes to be her mucus plug, describing it as 'jelly' and 'gooey', with no significant vaginal discharge beyond this observation.  She is Group B Streptococcus (GBS) positive.  She feels pelvic pressure and occasional stabbing pains but has not felt significant contractions. Her abdomen occasionally tightens, but she does not find it noticeable as labor contractions.  She engages in physical activity, including walking and using a yoga ball to help with pelvic discomfort. She continues with daily stretching and prenatal yoga to help with pelvic and lower back pain.  She has not experienced any significant weight gain recently, with her weight remaining stable from the previous week. Her blood pressure has been stable as well.    Pt denies contractions, vaginal bleeding, leaking fluid. Endorses good fetal movement Pt denies HA, VD or RUQ pain.   Objective  BP 122/77   Pulse 73   Ht 152.4 cm (5')   Wt 83 kg (183 lb)   LMP 08/20/2023 (Exact Date)   BMI 35.74 kg/m    Fetal Status: Fetal Heart Rate: 135 bpm Fundal Height (cm): 37 cm Movement: Present  Presentation: Cephalic Pre-pregnant weight: 44.1 kg (123 lb)  TWG: 27.2 kg (60 lb)  PE Chaperone note: A chaperone was included for sensitive portions of the exam- staff member name: Glenys Alstrom, CMA  Chaperone present for pelvic exam.  Gen: NAD  Pulm: No use  of accessory muscles, normal respirations Abdomen: Gravid, nontender Ext : No edema, no rashes.   Psych: Mood, insight, judgement intact SVE: 4-5/80/+1/anterior  Fundal height: S=D  Assessment   23 y.o. G1P0000 at [redacted]w[redacted]d by  05/26/2024, by Last Menstrual Period presenting for routine prenatal visit  The encounter diagnosis was Supervision of high risk pregnancy in third trimester (HHS-HCC).   Plan   Problem list reviewed and/or updated   Assessment & Plan Supervision of high risk pregnancy, third trimester with Group B Streptococcus (GBS) colonization  Cervix 4 cm dilated, 80% effaced, fetal head at +1 station. No regular contractions or active labor. Discussed induction preference and membrane sweep at 39 weeks if no spontaneous labor. Explained membrane sweep procedure and potential outcomes. - Advise monitoring for regular contractions <5 minutes apart, lasting =1 minute for 1 hour, as a sign to go to the hospital. - Administer antibiotics during labor for GBS colonization. - Perform membrane sweep at 39 weeks if labor has not started naturally. - Educate on labor signs and hospital arrival timing, especially if membranes rupture. - Recommend evening primrose oil vaginally to soften cervix. - Suggest raspberry leaf tea  - Encourage daily prenatal yoga and stretching for pelvic floor support  - Advise walking to aid pelvic movement, followed by stretching for discomfort relief.  Excessive weight gain during pregnancy, third trimester Weight gain stabilized; previous excessive gain noted between 30-32 weeks. Blood pressure stable, overall health good. - Continue monitoring weight gain and blood pressure.   Return in about 1 week (around  05/15/2024) for routine PNC.   Attestation Statement:   I personally performed the service, non-incident to. (WP)   ANNA MICHELLE MACKIE, CNM   This note has been created using automated tools and reviewed for accuracy by [facility].

## 2024-05-09 ENCOUNTER — Inpatient Hospital Stay
Admission: EM | Admit: 2024-05-09 | Discharge: 2024-05-10 | DRG: 807 | Disposition: A | Discharge status: Home / Self Care | Payer: MEDICAID | Attending: Obstetrics | Admitting: Obstetrics

## 2024-05-09 ENCOUNTER — Other Ambulatory Visit: Payer: Self-pay

## 2024-05-09 ENCOUNTER — Inpatient Hospital Stay: Payer: MEDICAID | Admitting: Anesthesiology

## 2024-05-09 ENCOUNTER — Encounter: Payer: Self-pay | Admitting: Obstetrics and Gynecology

## 2024-05-09 DIAGNOSIS — O99324 Drug use complicating childbirth: Secondary | ICD-10-CM | POA: Diagnosis present

## 2024-05-09 DIAGNOSIS — Z3A37 37 weeks gestation of pregnancy: Secondary | ICD-10-CM | POA: Diagnosis not present

## 2024-05-09 DIAGNOSIS — F1290 Cannabis use, unspecified, uncomplicated: Secondary | ICD-10-CM | POA: Diagnosis present

## 2024-05-09 DIAGNOSIS — O26893 Other specified pregnancy related conditions, third trimester: Principal | ICD-10-CM | POA: Diagnosis present

## 2024-05-09 DIAGNOSIS — O2603 Excessive weight gain in pregnancy, third trimester: Secondary | ICD-10-CM | POA: Diagnosis present

## 2024-05-09 DIAGNOSIS — F32A Depression, unspecified: Secondary | ICD-10-CM | POA: Diagnosis present

## 2024-05-09 DIAGNOSIS — Z2839 Other underimmunization status: Secondary | ICD-10-CM

## 2024-05-09 DIAGNOSIS — Z91040 Latex allergy status: Secondary | ICD-10-CM | POA: Diagnosis not present

## 2024-05-09 DIAGNOSIS — O09899 Supervision of other high risk pregnancies, unspecified trimester: Secondary | ICD-10-CM

## 2024-05-09 DIAGNOSIS — R030 Elevated blood-pressure reading, without diagnosis of hypertension: Secondary | ICD-10-CM | POA: Diagnosis present

## 2024-05-09 DIAGNOSIS — B951 Streptococcus, group B, as the cause of diseases classified elsewhere: Secondary | ICD-10-CM | POA: Diagnosis present

## 2024-05-09 DIAGNOSIS — O210 Mild hyperemesis gravidarum: Principal | ICD-10-CM

## 2024-05-09 DIAGNOSIS — O99824 Streptococcus B carrier state complicating childbirth: Secondary | ICD-10-CM | POA: Diagnosis present

## 2024-05-09 DIAGNOSIS — O9952 Diseases of the respiratory system complicating childbirth: Secondary | ICD-10-CM | POA: Diagnosis present

## 2024-05-09 DIAGNOSIS — J45909 Unspecified asthma, uncomplicated: Secondary | ICD-10-CM | POA: Diagnosis present

## 2024-05-09 DIAGNOSIS — O99320 Drug use complicating pregnancy, unspecified trimester: Secondary | ICD-10-CM | POA: Diagnosis present

## 2024-05-09 LAB — PROTEIN / CREATININE RATIO, URINE
Creatinine, Urine: 82 mg/dL
Protein Creatinine Ratio: 0.1 mg/mg{creat} (ref 0.00–0.15)
Total Protein, Urine: 8 mg/dL

## 2024-05-09 LAB — URINE DRUG SCREEN, QUALITATIVE (ARMC ONLY)
Amphetamines, Ur Screen: NOT DETECTED
Barbiturates, Ur Screen: NOT DETECTED
Benzodiazepine, Ur Scrn: NOT DETECTED
Cannabinoid 50 Ng, Ur ~~LOC~~: NOT DETECTED
Cocaine Metabolite,Ur ~~LOC~~: NOT DETECTED
MDMA (Ecstasy)Ur Screen: NOT DETECTED
Methadone Scn, Ur: NOT DETECTED
Opiate, Ur Screen: NOT DETECTED
Phencyclidine (PCP) Ur S: NOT DETECTED
Tricyclic, Ur Screen: NOT DETECTED

## 2024-05-09 LAB — COMPREHENSIVE METABOLIC PANEL WITH GFR
ALT: 13 U/L (ref 0–44)
AST: 22 U/L (ref 15–41)
Albumin: 3.1 g/dL — ABNORMAL LOW (ref 3.5–5.0)
Alkaline Phosphatase: 130 U/L — ABNORMAL HIGH (ref 38–126)
Anion gap: 10 (ref 5–15)
BUN: 6 mg/dL (ref 6–20)
CO2: 20 mmol/L — ABNORMAL LOW (ref 22–32)
Calcium: 9 mg/dL (ref 8.9–10.3)
Chloride: 107 mmol/L (ref 98–111)
Creatinine, Ser: 0.6 mg/dL (ref 0.44–1.00)
GFR, Estimated: >60 mL/min (ref >60)
Glucose, Bld: 78 mg/dL (ref 70–99)
Potassium: 3.7 mmol/L (ref 3.5–5.1)
Sodium: 137 mmol/L (ref 135–145)
Total Bilirubin: 0.8 mg/dL (ref 0.0–1.2)
Total Protein: 6.8 g/dL (ref 6.5–8.1)

## 2024-05-09 LAB — TYPE AND SCREEN
ABO/RH(D): O POS
Antibody Screen: NEGATIVE

## 2024-05-09 LAB — CBC
HCT: 35.5 % — ABNORMAL LOW (ref 36.0–46.0)
Hemoglobin: 12.7 g/dL (ref 12.0–15.0)
MCH: 32.6 pg (ref 26.0–34.0)
MCHC: 35.8 g/dL (ref 30.0–36.0)
MCV: 91 fL (ref 80.0–100.0)
Platelets: 260 K/uL (ref 150–400)
RBC: 3.9 MIL/uL (ref 3.87–5.11)
RDW: 11.5 % (ref 11.5–15.5)
WBC: 15.2 K/uL — ABNORMAL HIGH (ref 4.0–10.5)
nRBC: 0 % (ref 0.0–0.2)

## 2024-05-09 LAB — RPR: RPR Ser Ql: NONREACTIVE

## 2024-05-09 MED ORDER — EPHEDRINE 5 MG/ML INJ: 10.0000 mg | INTRAVENOUS | Status: DC | PRN

## 2024-05-09 MED ORDER — DIPHENHYDRAMINE HCL 50 MG/ML IJ SOLN: 12.5000 mg | INTRAMUSCULAR | Status: DC | PRN

## 2024-05-09 MED ORDER — ACETAMINOPHEN 500 MG PO TABS: 1000.0000 mg | ORAL_TABLET | Freq: Four times a day (QID) | ORAL | Status: DC | PRN

## 2024-05-09 MED ORDER — SOD CITRATE-CITRIC ACID 500-334 MG/5ML PO SOLN: 30.0000 mL | ORAL | Status: DC | PRN

## 2024-05-09 MED ORDER — PRENATAL MULTIVITAMIN CH
1.0000 | ORAL_TABLET | Freq: Every day | ORAL | Status: DC
  Administered 2024-05-09 – 2024-05-10 (×2): 1 via ORAL
  Filled 2024-05-09 (×2): qty 1

## 2024-05-09 MED ORDER — DIPHENHYDRAMINE HCL 25 MG PO CAPS: 25.0000 mg | ORAL_CAPSULE | Freq: Four times a day (QID) | ORAL | Status: DC | PRN

## 2024-05-09 MED ORDER — SODIUM CHLORIDE 0.9% FLUSH: 3.0000 mL | INTRAVENOUS | Status: DC | PRN

## 2024-05-09 MED ORDER — SODIUM CHLORIDE 0.9 % IV SOLN: 250.0000 mL | INTRAVENOUS | Status: DC | PRN

## 2024-05-09 MED ORDER — ONDANSETRON HCL 4 MG PO TABS: 4.0000 mg | ORAL_TABLET | ORAL | Status: DC | PRN

## 2024-05-09 MED ORDER — SODIUM CHLORIDE 0.9 % IV SOLN
1.0000 g | INTRAVENOUS | Status: DC
  Administered 2024-05-09 (×2): 1 g via INTRAVENOUS
  Filled 2024-05-09 (×4): qty 1000

## 2024-05-09 MED ORDER — MEASLES, MUMPS & RUBELLA VAC IJ SOLR
0.5000 mL | Freq: Once | INTRAMUSCULAR | Status: AC
  Administered 2024-05-10: 0.5 mL via SUBCUTANEOUS
  Filled 2024-05-09 (×2): qty 0.5

## 2024-05-09 MED ORDER — SODIUM CHLORIDE 0.9% FLUSH
3.0000 mL | Freq: Two times a day (BID) | INTRAVENOUS | Status: DC
  Administered 2024-05-09 (×2): 3 mL via INTRAVENOUS

## 2024-05-09 MED ORDER — TETANUS-DIPHTH-ACELL PERTUSSIS 5-2.5-18.5 LF-MCG/0.5 IM SUSY: 0.5000 mL | PREFILLED_SYRINGE | Freq: Once | INTRAMUSCULAR | Status: DC

## 2024-05-09 MED ORDER — LACTATED RINGERS IV SOLN: 500.0000 mL | Freq: Once | INTRAVENOUS | Status: DC

## 2024-05-09 MED ORDER — AMMONIA AROMATIC IN INHA
RESPIRATORY_TRACT | Status: AC
  Filled 2024-05-09: qty 10

## 2024-05-09 MED ORDER — SODIUM CHLORIDE 0.9 % IV SOLN
250.0000 mL | INTRAVENOUS | Status: DC | PRN
Start: 2024-05-09 — End: 2024-05-10

## 2024-05-09 MED ORDER — PHENYLEPHRINE 80 MCG/ML (10ML) SYRINGE FOR IV PUSH (FOR BLOOD PRESSURE SUPPORT): 80.0000 ug | PREFILLED_SYRINGE | INTRAVENOUS | Status: DC | PRN

## 2024-05-09 MED ORDER — BENZOCAINE-MENTHOL 20-0.5 % EX AERO
1.0000 | INHALATION_SPRAY | CUTANEOUS | Status: DC | PRN
Start: 2024-05-09 — End: 2024-05-10

## 2024-05-09 MED ORDER — DIBUCAINE (PERIANAL) 1 % EX OINT: 1.0000 | TOPICAL_OINTMENT | CUTANEOUS | Status: DC | PRN

## 2024-05-09 MED ORDER — MISOPROSTOL 200 MCG PO TABS
ORAL_TABLET | ORAL | Status: AC
  Filled 2024-05-09: qty 4

## 2024-05-09 MED ORDER — FENTANYL CITRATE (PF) 100 MCG/2ML IJ SOLN: 50.0000 ug | INTRAMUSCULAR | Status: DC | PRN

## 2024-05-09 MED ORDER — ZOLPIDEM TARTRATE 5 MG PO TABS: 5.0000 mg | ORAL_TABLET | Freq: Every evening | ORAL | Status: DC | PRN

## 2024-05-09 MED ORDER — WITCH HAZEL-GLYCERIN EX PADS
1.0000 | MEDICATED_PAD | CUTANEOUS | Status: DC | PRN
Start: 2024-05-09 — End: 2024-05-10

## 2024-05-09 MED ORDER — LIDOCAINE-EPINEPHRINE (PF) 1.5 %-1:200000 IJ SOLN
INTRAMUSCULAR | Status: DC | PRN
  Administered 2024-05-09: 3 mL via EPIDURAL

## 2024-05-09 MED ORDER — OXYCODONE HCL 5 MG PO TABS: 5.0000 mg | ORAL_TABLET | ORAL | Status: DC | PRN

## 2024-05-09 MED ORDER — FLEET ENEMA RE ENEM: 1.0000 | ENEMA | Freq: Every day | RECTAL | Status: DC | PRN

## 2024-05-09 MED ORDER — FENTANYL-BUPIVACAINE-NACL 0.5-0.125-0.9 MG/250ML-% EP SOLN
12.0000 mL/h | EPIDURAL | Status: DC | PRN
  Administered 2024-05-09: 12 mL/h via EPIDURAL
  Filled 2024-05-09: qty 250

## 2024-05-09 MED ORDER — SODIUM CHLORIDE 0.9 % IV SOLN
2.0000 g | Freq: Once | INTRAVENOUS | Status: AC
  Administered 2024-05-09: 2 g via INTRAVENOUS
  Filled 2024-05-09: qty 2000

## 2024-05-09 MED ORDER — ONDANSETRON HCL 4 MG/2ML IJ SOLN: 4.0000 mg | Freq: Four times a day (QID) | INTRAMUSCULAR | Status: DC | PRN

## 2024-05-09 MED ORDER — ACETAMINOPHEN 325 MG PO TABS
650.0000 mg | ORAL_TABLET | ORAL | Status: DC | PRN
  Administered 2024-05-09: 650 mg via ORAL
  Filled 2024-05-09 (×2): qty 2

## 2024-05-09 MED ORDER — LACTATED RINGERS IV SOLN: INTRAVENOUS | Status: DC

## 2024-05-09 MED ORDER — SENNOSIDES-DOCUSATE SODIUM 8.6-50 MG PO TABS
2.0000 | ORAL_TABLET | ORAL | Status: DC
  Administered 2024-05-09: 2 via ORAL
  Filled 2024-05-09: qty 2

## 2024-05-09 MED ORDER — LIDOCAINE HCL (PF) 1 % IJ SOLN
INTRAMUSCULAR | Status: DC | PRN
  Administered 2024-05-09: 3 mL via SUBCUTANEOUS

## 2024-05-09 MED ORDER — SODIUM CHLORIDE 0.9% FLUSH: 3.0000 mL | Freq: Two times a day (BID) | INTRAVENOUS | Status: DC

## 2024-05-09 MED ORDER — LACTATED RINGERS IV SOLN: 500.0000 mL | INTRAVENOUS | Status: DC | PRN

## 2024-05-09 MED ORDER — FAMOTIDINE 20 MG PO TABS: 20.0000 mg | ORAL_TABLET | Freq: Two times a day (BID) | ORAL | Status: DC | PRN

## 2024-05-09 MED ORDER — ONDANSETRON HCL 4 MG/2ML IJ SOLN: 4.0000 mg | INTRAMUSCULAR | Status: DC | PRN

## 2024-05-09 MED ORDER — LIDOCAINE HCL (PF) 1 % IJ SOLN
30.0000 mL | INTRAMUSCULAR | Status: DC | PRN
  Filled 2024-05-09: qty 30

## 2024-05-09 MED ORDER — COCONUT OIL OIL: 1.0000 | TOPICAL_OIL | Status: DC | PRN

## 2024-05-09 MED ORDER — OXYTOCIN 10 UNIT/ML IJ SOLN
INTRAMUSCULAR | Status: AC
  Filled 2024-05-09: qty 2

## 2024-05-09 MED ORDER — BUPIVACAINE HCL (PF) 0.25 % IJ SOLN
INTRAMUSCULAR | Status: DC | PRN
  Administered 2024-05-09 (×2): 4 mL via EPIDURAL

## 2024-05-09 MED ORDER — IBUPROFEN 600 MG PO TABS
600.0000 mg | ORAL_TABLET | Freq: Four times a day (QID) | ORAL | Status: DC
  Administered 2024-05-09 – 2024-05-10 (×4): 600 mg via ORAL
  Filled 2024-05-09 (×4): qty 1

## 2024-05-09 MED ORDER — OXYTOCIN BOLUS FROM INFUSION
333.0000 mL | Freq: Once | INTRAVENOUS | Status: AC
  Administered 2024-05-09: 333 mL via INTRAVENOUS

## 2024-05-09 MED ORDER — SIMETHICONE 80 MG PO CHEW: 80.0000 mg | CHEWABLE_TABLET | ORAL | Status: DC | PRN

## 2024-05-09 MED ORDER — BISACODYL 10 MG RE SUPP: 10.0000 mg | Freq: Every day | RECTAL | Status: DC | PRN

## 2024-05-09 MED ORDER — OXYTOCIN-SODIUM CHLORIDE 30-0.9 UT/500ML-% IV SOLN
2.5000 [IU]/h | INTRAVENOUS | Status: DC
  Filled 2024-05-09: qty 500
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
## 2024-05-09 NOTE — Anesthesia Procedure Notes (Signed)
 Epidural Patient location during procedure: OB Start time: 05/09/2024 2:08 AM End time: 05/09/2024 2:13 AM  Staffing Anesthesiologist: Caiden Arteaga, Fairy POUR, MD Performed: anesthesiologist   Preanesthetic Checklist Completed: patient identified, IV checked, site marked, risks and benefits discussed, surgical consent, monitors and equipment checked, pre-op evaluation and timeout performed  Epidural Patient position: sitting Prep: ChloraPrep Patient monitoring: heart rate, continuous pulse ox and blood pressure Approach: midline Location: L3-L4 Injection technique: LOR saline  Needle:  Needle type: Tuohy  Needle gauge: 17 G Needle length: 9 cm and 9 Needle insertion depth: 7 cm Catheter type: closed end flexible Catheter size: 19 Gauge Catheter at skin depth: 13 cm Test dose: negative and 1.5% lidocaine  with Epi 1:200 K  Assessment Sensory level: T10 Events: blood not aspirated, no cerebrospinal fluid, injection not painful, no injection resistance, no paresthesia and negative IV test  Additional Notes 1 attempt Pt. Evaluated and documentation done after procedure finished. Patient identified. Risks/Benefits/Options discussed with patient including but not limited to bleeding, infection, nerve damage, paralysis, failed block, incomplete pain control, headache, blood pressure changes, nausea, vomiting, reactions to medication both or allergic, itching and postpartum back pain. Confirmed with bedside nurse the patient's most recent platelet count. Confirmed with patient that they are not currently taking any anticoagulation, have any bleeding history or any family history of bleeding disorders. Patient expressed understanding and wished to proceed. All questions were answered. Sterile technique was used throughout the entire procedure. Please see nursing notes for vital signs. Test dose was given through epidural catheter and negative prior to continuing to dose epidural or start  infusion. Warning signs of high block given to the patient including shortness of breath, tingling/numbness in hands, complete motor block, or any concerning symptoms with instructions to call for help. Patient was given instructions on fall risk and not to get out of bed. All questions and concerns addressed with instructions to call with any issues or inadequate analgesia.    Patient tolerated the insertion well without immediate complications.Reason for block:procedure for pain

## 2024-05-09 NOTE — Progress Notes (Signed)
 Tiffany Bush is a 23 y.o. G1P0 at [redacted]w[redacted]d by LMP admitted for active labor  Subjective:   Objective: BP 135/79   Pulse 81   Temp 98.1 F (36.7 C) (Oral)   Resp 20   Ht 5' (1.524 m)   Wt 83 kg   LMP 08/20/2023 (Exact Date)   SpO2 98%   BMI 35.74 kg/m  I/O last 3 completed shifts: In: -  Out: 200 [Urine:200] No intake/output data recorded.  FHT:  FHR: 150 bpm, variability: moderate,  accelerations:  Present,  decelerations:  Present early UC:   regular, every 2 minutes SVE:   Dilation: 8 Effacement (%): 90 Station: 0, Plus 1 Exam by:: Polos RN  Labs: Lab Results  Component Value Date   WBC 15.2 (H) 05/09/2024   HGB 12.7 05/09/2024   HCT 35.5 (L) 05/09/2024   MCV 91.0 05/09/2024   PLT 260 05/09/2024    Assessment / Plan: Spontaneous labor, progressing normally  Labor: actively started pushing Anticipate NSVD  Heather Penton, MD 05/09/2024, 10:24 AM

## 2024-05-09 NOTE — OB Triage Note (Signed)
 Tiffany Bush Keep 22 y.o. @[redacted]w[redacted]d  G1P0  presents to Labor & Delivery triage via wheelchair steered by ED staff reporting contractions that started yesterday afternoon (8/10 back pain). Pt GBS+. She denies signs and symptoms consistent with rupture of membranes or active vaginal bleeding. She states positive fetal movement. External FM and TOCO applied to non-tender abdomen. Vital signs obtained. BP 150/89. Cycling q15. SVE 6.5/90/0-+1. Patient oriented to care environment including call bell and bed control use. Vernel, CNM notified of patient's arrival. Plan to admit. Evanell Redlich L. Alvaretta Eisenberger, RN BSN 05/09/2024 1:28 AM

## 2024-05-09 NOTE — Progress Notes (Signed)
 L&D Note    Subjective:  Comfortable with epidural  Objective:    Current Vital Signs 24h Vital Sign Ranges  T 98.3 F (36.8 C) Temp  Avg: 98.3 F (36.8 C)  Min: 98.3 F (36.8 C)  Max: 98.3 F (36.8 C)  BP (!) 130/41 BP  Min: 109/54  Max: 150/89  HR 69 Pulse  Avg: 90  Min: 69  Max: 101  RR 15 Resp  Avg: 15  Min: 15  Max: 15  SaO2 97 %   SpO2  Avg: 98.5 %  Min: 95 %  Max: 100 %      Gen: alert, cooperative, no distress FHR: Baseline: 135 bpm, Variability: moderate, Accels: Present, Decels: none Toco: irregular, every 3-8 minutes SVE: Dilation: 7 Effacement (%): 90 Cervical Position: Middle, Anterior Station: Plus 1 Presentation: Vertex Exam by:: Vernel, CNM  Medications SCHEDULED MEDICATIONS   ammonia        misoprostol        oxytocin        oxytocin  40 units in LR 1000 mL  333 mL Intravenous Once   sodium chloride  flush  3 mL Intravenous Q12H    MEDICATION INFUSIONS   sodium chloride      ampicillin  (OMNIPEN) IV Stopped (05/09/24 9380)   fentaNYL  2 mcg/mL w/bupivacaine  0.125% in NS 250 mL 12 mL/hr (05/09/24 0222)   lactated ringers      lactated ringers      lactated ringers  125 mL/hr at 05/09/24 0156   oxytocin       PRN MEDICATIONS  sodium chloride , acetaminophen , ammonia , diphenhydrAMINE , ePHEDrine , ePHEDrine , famotidine , fentaNYL  (SUBLIMAZE ) injection, fentaNYL  2 mcg/mL w/bupivacaine  0.125% in NS 250 mL, lactated ringers , lidocaine  (PF), misoprostol , ondansetron , oxytocin , phenylephrine , phenylephrine , sodium chloride  flush, sodium citrate-citric acid    Assessment & Plan:  23 y.o. G1P0 at [redacted]w[redacted]d admitted for normal labor  -Labor: Active phase labor. and Inadequate uterine activity - intensity or frequency.  -Fetal Well-being: Category I -GBS: positive - received Amp x 2 -Membranes ruptured, clear fluid, artificially at 0648 -Intervention: AROM for infrequent contractions  -Analgesia: regional anesthesia   Therisa CHRISTELLA Vernel, CNM  05/09/2024 7:00 AM  [facility]

## 2024-05-09 NOTE — Discharge Summary (Signed)
 Postpartum Discharge Summary  Patient Name: Tiffany Bush DOB: 2001-07-21 MRN: [id number]  Date of admission: 05/09/2024 Delivery date:05/09/2024 Delivering provider: VERDON KEEN Date of discharge: 05/10/2024  Primary OB: [facility] OFE:Ejupzwu'd last menstrual period was 08/20/2023 (exact date). EDC Estimated Date of Delivery: 05/26/24 Gestational Age at Delivery: [redacted]w[redacted]d   Admitting diagnosis: Normal labor and delivery [O80] Intrauterine pregnancy: [redacted]w[redacted]d     Secondary diagnosis:   Principal Problem:   Normal labor and delivery Active Problems:   Marijuana use during pregnancy   Excessive weight gain during pregnancy in third trimester   Asthma during pregnancy   Depression affecting pregnancy   Positive GBS test   Rubella non-immune status, antepartum   Discharge Diagnosis: Term Pregnancy Delivered      Hospital course: Onset of Labor With Vaginal Delivery      23 y.o. yo G1P1001 at [redacted]w[redacted]d was admitted in Active Labor on 05/09/2024. Labor course was uncomplicated Membrane Rupture Time/Date: 6:48 AM,05/09/2024  Delivery Method:Vaginal, Spontaneous Operative Delivery:N/A Episiotomy: None Lacerations:  Labial Patient had an uncomplicated postpartum course.  She is ambulating, tolerating a regular diet, passing flatus, and urinating well. Patient is discharged home in stable condition on 05/10/24.  Newborn Data: Birth date:05/09/2024 Birth time:11:04 AM Gender:Female [place] Living status:Living Apgars:8 ,9  Weight:2600 g                                            Post partum procedures:none Augmentation:: AROM Complications: None Delivery Type: spontaneous vaginal delivery Anesthesia: epidural anesthesia Placenta: spontaneous To Pathology: No   Prenatal Labs:  Blood type/Rh O POS Performed at [facility], [address] [place], [place] [address]    Antibody screen Negative    Rubella Non-immune    Varicella Not immune  RPR NR    HBsAg  Neg   Hep C NR   HIV NR    GC neg  Chlamydia neg  Genetic screening cfDNA negative   1 hour GTT 93  3 hour GTT N/A  GBS POS       MMR: was given Varivax vaccine given: was not indicated Tdap vaccine: Given prenatally   Transfusion:No  Physical exam  Vitals:   05/09/24 1900 05/09/24 2309 05/10/24 0430 05/10/24 0806  BP: 138/78 113/63 129/72 112/73  Pulse: 96 86 66 64  Resp: 18 18 18 20   Temp: 98.1 F (36.7 C) 98.3 F (36.8 C) 98.6 F (37 C) 98.4 F (36.9 C)  TempSrc: Oral Oral Oral Oral  SpO2: 100% 99% 100% 100%  Weight:      Height:       General: alert, cooperative, and no distress Lochia: appropriate Uterine Fundus: firm Perineum:minimal edema/repair well approximated DVT Evaluation: No evidence of DVT seen on physical exam. Negative Homan's sign. No cords or calf tenderness. No significant calf/ankle edema.  Labs: Lab Results  Component Value Date   WBC 13.8 (H) 05/10/2024   HGB 9.8 (L) 05/10/2024   HCT 27.7 (L) 05/10/2024   MCV 92.3 05/10/2024   PLT 211 05/10/2024      Latest Ref Rng & Units 05/09/2024    1:44 AM  CMP  Glucose 70 - 99 mg/dL 78   BUN 6 - 20 mg/dL 6   Creatinine 9.55 - 8.99 mg/dL 9.39   Sodium 864 - 854 mmol/L 137   Potassium 3.5 - 5.1 mmol/L 3.7  Chloride 98 - 111 mmol/L 107   CO2 22 - 32 mmol/L 20   Calcium 8.9 - 10.3 mg/dL 9.0   Total Protein 6.5 - 8.1 g/dL 6.8   Total Bilirubin 0.0 - 1.2 mg/dL 0.8   Alkaline Phos 38 - 126 U/L 130   AST 15 - 41 U/L 22   ALT 0 - 44 U/L 13    Edinburgh Score:    05/10/2024   12:54 PM  Edinburgh Postnatal Depression Scale Screening Tool  I have been able to laugh and see the funny side of things. 0  I have looked forward with enjoyment to things. 0  I have blamed myself unnecessarily when things went wrong. 0  I have been anxious or worried for no good reason. 0  I have felt scared or panicky for no good reason. 0  Things have been getting on top of me. 1  I have been so unhappy that  I have had difficulty sleeping. 0  I have felt sad or miserable. 0  I have been so unhappy that I have been crying. 0  The thought of harming myself has occurred to me. 0  Edinburgh Postnatal Depression Scale Total 1     Postpartum VTE Prophylaxis  Recommend 6 weeks of prophylactic anticoagulation with LMWH or subcutaneous unfractionated heparin if 1 or more high risk factor is present.  Recommend 14 days of prophylactic anticoagulation with LMWH or subcutaneous unfractionated heparin if 3 or more moderate risk factors are present.   Risk assessment for postpartum VTE and prophylactic treatment: High risk factors: None Moderate risk factors: None  Postpartum VTE prophylaxis with LMWH not indicated    After visit meds:  Allergies as of 05/10/2024       Reactions   Zoloft  [sertraline  Hcl] Other (See Comments)   Suicidal thoughts    Doxycycline  Nausea And Vomiting   Pill/capsule only Patient did tolerate IV doxycycline  without complication   Latex Rash        Medication List     STOP taking these medications    famotidine  20 MG tablet Commonly known as: PEPCID    metoCLOPramide  10 MG tablet Commonly known as: REGLAN    phenylephrine -shark liver oil-mineral oil-petrolatum 0.25-14-74.9 % rectal ointment Commonly known as: PREPARATION H       TAKE these medications    acetaminophen  325 MG tablet Commonly known as: Tylenol  Take 2 tablets (650 mg total) by mouth every 4 (four) hours as needed (for pain scale < 4).   albuterol  108 (90 Base) MCG/ACT inhaler Commonly known as: VENTOLIN  HFA Inhale 1-2 puffs into the lungs every 6 (six) hours as needed for wheezing or shortness of breath.   benzocaine -Menthol  20-0.5 % Aero Commonly known as: DERMOPLAST Apply 1 Application topically as needed for irritation (perineal discomfort).   coconut oil Oil Apply 1 Application topically as needed.   dibucaine 1 % Oint Commonly known as: NUPERCAINAL Place 1 Application  rectally as needed for hemorrhoids (if tucks not working).   ibuprofen  600 MG tablet Commonly known as: ADVIL  Take 1 tablet (600 mg total) by mouth every 6 (six) hours.   senna-docusate 8.6-50 MG tablet Commonly known as: Senokot-S Take 2 tablets by mouth daily.   simethicone  80 MG chewable tablet Commonly known as: MYLICON Chew 1 tablet (80 mg total) by mouth as needed for flatulence.   witch hazel-glycerin  pad Commonly known as: TUCKS Apply 1 Application topically as needed for hemorrhoids (for pain).       Discharge  home in stable condition Infant Feeding: Bottle Infant Disposition:home with mother Discharge instruction: per After Visit Summary and Postpartum booklet. Activity: Advance as tolerated. Pelvic rest for 6 weeks.  Diet: routine diet Anticipated Birth Control:  Contraceptives: Condoms Postpartum Appointment:6 weeks Additional Postpartum F/U: Postpartum Depression checkup Future Appointments:No future appointments. Follow up Visit:  Follow-up Information     Aisha Heller, CNM. Schedule an appointment as soon as possible for a visit in 2 week(s).   Specialty: Obstetrics Why: mood check Contact information: [address] [place] [address] [phone]         Verdon Keen, MD. Schedule an appointment as soon as possible for a visit in 6 week(s).   Specialty: Obstetrics and Gynecology Why: postpartum visit Contact information: [address] [place] [address] [phone]                 Plan:  Tiffany Bush was discharged to home in good condition. Follow-up appointment as directed.    Signed:  Heller Aisha CNM

## 2024-05-09 NOTE — Progress Notes (Signed)
 Pt feeling urge to push, practice push with good effort and movement, CNM notified, pt educated on pushing and room ready for delivery.

## 2024-05-09 NOTE — Anesthesia Preprocedure Evaluation (Signed)
 Anesthesia Evaluation  Patient identified by MRN, date of birth, ID band Patient awake    Reviewed: Allergy & Precautions, NPO status , Patient's Chart, lab work & pertinent test results  History of Anesthesia Complications (+) PONV and history of anesthetic complications  Airway Mallampati: III  TM Distance: <3 FB Neck ROM: full    Dental  (+) Chipped   Pulmonary asthma    Pulmonary exam normal        Cardiovascular Exercise Tolerance: Good (-) hypertensionnegative cardio ROS Normal cardiovascular exam     Neuro/Psych  Headaches    GI/Hepatic negative GI ROS,,,  Endo/Other    Renal/GU Renal disease  negative genitourinary   Musculoskeletal   Abdominal   Peds  Hematology negative hematology ROS (+)   Anesthesia Other Findings Past Medical History: No date: Anxiety     Comment:  no meds  No date: Asthma     Comment:  well controlled-exercise induced No date: Complication of anesthesia No date: Family history of adverse reaction to anesthesia     Comment:  mom-n/v No date: Headache     Comment:  more frequent during the summer with her allergies No date: Kidney stones No date: PONV (postoperative nausea and vomiting)     Comment:  nausea  Past Surgical History: No date: CYSTOSCOPY 04/14/2018: LAPAROSCOPY; N/A     Comment:  Procedure: LAPAROSCOPY DIAGNOSTIC;  Surgeon:               Lovetta Debby PARAS, MD;  Location: [facility] ORS;                Service: Gynecology;  Laterality: N/A; 04/14/2018: LAPAROSCOPY; N/A     Comment:  Procedure: LAPAROSCOPY OPERATIVE;  Surgeon:               Lovetta Debby PARAS, MD;  Location: [facility] ORS;                Service: Gynecology;  Laterality: N/A; No date: TONSILLECTOMY     Comment:  age 81  BMI    Body Mass Index: 35.74 kg/m      Reproductive/Obstetrics (+) Pregnancy                              Anesthesia Physical Anesthesia  Plan  ASA: 3  Anesthesia Plan: Epidural   Post-op Pain Management:    Induction:   PONV Risk Score and Plan:   Airway Management Planned: Natural Airway  Additional Equipment:   Intra-op Plan:   Post-operative Plan:   Informed Consent: I have reviewed the patients History and Physical, chart, labs and discussed the procedure including the risks, benefits and alternatives for the proposed anesthesia with the patient or authorized representative who has indicated his/her understanding and acceptance.     Dental Advisory Given  Plan Discussed with: Anesthesiologist  Anesthesia Plan Comments: (Patient reports no bleeding problems and no anticoagulant use.   Patient consented for risks of anesthesia including but not limited to:  - adverse reactions to medications - risk of bleeding, infection and or nerve damage from epidural that could lead to paralysis - risk of headache or failed epidural - nerve damage due to positioning - that if epidural is used for C-section that there is a chance of epidural failure requiring spinal placement or conversion to GA - Damage to heart, brain, lungs, other parts of body or loss of life  Patient voiced understanding and assent.)  Anesthesia Quick Evaluation

## 2024-05-10 LAB — CBC
HCT: 27.7 % — ABNORMAL LOW (ref 36.0–46.0)
Hemoglobin: 9.8 g/dL — ABNORMAL LOW (ref 12.0–15.0)
MCH: 32.7 pg (ref 26.0–34.0)
MCHC: 35.4 g/dL (ref 30.0–36.0)
MCV: 92.3 fL (ref 80.0–100.0)
Platelets: 211 K/uL (ref 150–400)
RBC: 3 MIL/uL — ABNORMAL LOW (ref 3.87–5.11)
RDW: 11.5 % (ref 11.5–15.5)
WBC: 13.8 K/uL — ABNORMAL HIGH (ref 4.0–10.5)
nRBC: 0 % (ref 0.0–0.2)

## 2024-05-10 MED ORDER — BENZOCAINE-MENTHOL 20-0.5 % EX AERO: 1.0000 | INHALATION_SPRAY | CUTANEOUS | Status: AC | PRN

## 2024-05-10 MED ORDER — ACETAMINOPHEN 325 MG PO TABS: 650.0000 mg | ORAL_TABLET | ORAL | Status: AC | PRN

## 2024-05-10 MED ORDER — IBUPROFEN 600 MG PO TABS: 600.0000 mg | ORAL_TABLET | Freq: Four times a day (QID) | ORAL | 0 refills | Status: AC

## 2024-05-10 MED ORDER — SENNOSIDES-DOCUSATE SODIUM 8.6-50 MG PO TABS: 2.0000 | ORAL_TABLET | ORAL | Status: AC

## 2024-05-10 MED ORDER — COCONUT OIL OIL: 1.0000 | TOPICAL_OIL | Status: AC | PRN

## 2024-05-10 MED ORDER — SIMETHICONE 80 MG PO CHEW: 80.0000 mg | CHEWABLE_TABLET | ORAL | Status: AC | PRN

## 2024-05-10 MED ORDER — DIBUCAINE (PERIANAL) 1 % EX OINT: 1.0000 | TOPICAL_OINTMENT | CUTANEOUS | Status: AC | PRN

## 2024-05-10 MED ORDER — WITCH HAZEL-GLYCERIN EX PADS: 1.0000 | MEDICATED_PAD | CUTANEOUS | Status: AC | PRN

## 2024-05-10 NOTE — Progress Notes (Signed)
 Discharge instructions reviewed with patient and her family.  Questions answered and follow up care reviewed.  Printed copies givent to the patient for reference after discharge home.

## 2024-05-12 NOTE — Anesthesia Postprocedure Evaluation (Signed)
 Anesthesia Post Note  Patient: Tiffany Bush  Procedure(s) Performed: AN AD HOC LABOR EPIDURAL  Anesthesia Type: Epidural Anesthetic complications: no Comments: Patient was discharged before she could be seen by our Post Op epidural team   No notable events documented.   Last Vitals: There were no vitals filed for this visit.  Last Pain: There were no vitals filed for this visit.               Tiffany Bush
